# Patient Record
Sex: Male | Born: 1945
Health system: Southern US, Community
[De-identification: ages and names within clinical notes are randomized; demographics above are authoritative.]

## PROBLEM LIST (undated history)

## (undated) DIAGNOSIS — E785 Hyperlipidemia, unspecified: Secondary | ICD-10-CM

## (undated) DIAGNOSIS — J309 Allergic rhinitis, unspecified: Secondary | ICD-10-CM

## (undated) DIAGNOSIS — J189 Pneumonia, unspecified organism: Secondary | ICD-10-CM

## (undated) DIAGNOSIS — E119 Type 2 diabetes mellitus without complications: Secondary | ICD-10-CM

## (undated) DIAGNOSIS — J449 Chronic obstructive pulmonary disease, unspecified: Secondary | ICD-10-CM

## (undated) DIAGNOSIS — I2510 Atherosclerotic heart disease of native coronary artery without angina pectoris: Secondary | ICD-10-CM

## (undated) HISTORY — DX: Allergic rhinitis, unspecified: J30.9

## (undated) HISTORY — DX: Hyperlipidemia, unspecified: E78.5

## (undated) HISTORY — DX: Pneumonia, unspecified organism: J18.9

## (undated) HISTORY — DX: Chronic obstructive pulmonary disease, unspecified: J44.9

## (undated) HISTORY — PX: CARDIAC CATHETERIZATION: SHX172

## (undated) HISTORY — DX: Type 2 diabetes mellitus without complications: E11.9

---

## 1991-12-21 HISTORY — PX: ACHILLES TENDON SURGERY: SHX542

## 2000-04-26 ENCOUNTER — Encounter: Payer: Self-pay | Admitting: Otolaryngology

## 2000-04-27 ENCOUNTER — Ambulatory Visit (HOSPITAL_COMMUNITY): Admission: RE | Admit: 2000-04-27 | Discharge: 2000-04-27 | Payer: Self-pay | Admitting: Otolaryngology

## 2003-08-13 ENCOUNTER — Encounter: Admission: RE | Admit: 2003-08-13 | Discharge: 2003-11-11 | Payer: Self-pay | Admitting: Family Medicine

## 2004-09-14 ENCOUNTER — Encounter: Admission: RE | Admit: 2004-09-14 | Discharge: 2004-09-14 | Payer: Self-pay | Admitting: Family Medicine

## 2005-10-06 ENCOUNTER — Ambulatory Visit: Payer: Self-pay | Admitting: Critical Care Medicine

## 2005-10-20 ENCOUNTER — Ambulatory Visit: Payer: Self-pay

## 2005-11-16 ENCOUNTER — Ambulatory Visit: Payer: Self-pay | Admitting: Critical Care Medicine

## 2006-03-29 ENCOUNTER — Ambulatory Visit: Payer: Self-pay | Admitting: Critical Care Medicine

## 2006-08-03 ENCOUNTER — Ambulatory Visit: Payer: Self-pay | Admitting: Critical Care Medicine

## 2007-02-24 ENCOUNTER — Ambulatory Visit: Payer: Self-pay | Admitting: Critical Care Medicine

## 2009-01-03 ENCOUNTER — Ambulatory Visit: Payer: Self-pay | Admitting: Critical Care Medicine

## 2009-01-03 DIAGNOSIS — F17200 Nicotine dependence, unspecified, uncomplicated: Secondary | ICD-10-CM | POA: Insufficient documentation

## 2009-01-03 DIAGNOSIS — J449 Chronic obstructive pulmonary disease, unspecified: Secondary | ICD-10-CM | POA: Insufficient documentation

## 2009-01-06 ENCOUNTER — Encounter: Payer: Self-pay | Admitting: Critical Care Medicine

## 2010-03-24 ENCOUNTER — Ambulatory Visit: Payer: Self-pay | Admitting: Critical Care Medicine

## 2011-01-19 NOTE — Assessment & Plan Note (Signed)
Summary: Pulmonary OV   CC:  Follow up.  Last seen by PW 12/2008.  Pt states breathing is the same- no better or worse.  States he will have SOB when over exerting self.  States he will occ have wheezing and chest tightness.  Denies cough.  States he is still smoking 1 - 1 1/2 ppd.  C/o sinus pressure and drainage with clear mucus x 3-4 months.  History of Present Illness: Pulmonary OV 65  yo WM with chronic bronchitis,  mild peripheral airflow obstruction GOLDS stage 0, ongoing smoking use.     March 24, 2010 3:43 PM This pt is still wheezing and still has chest tightness.  The pt was   last seen one year ago. The pt is still smoking 1 1/2 ppd. ,   Has tried everything and nothing really worked to quit smoking but did have marginal benefit with nicotine patch.   Pt denies any significant sore throat, nasal congestion or excess secretions, fever, chills, sweats, unintended weight loss, pleurtic or exertional chest pain, orthopnea PND, or leg swelling Pt denies any increase in rescue therapy over baseline, denies waking up needing it or having any early am or nocturnal exacerbations of coughing/wheezing/or dyspnea.     Preventive Screening-Counseling & Management  Alcohol-Tobacco     Smoking Status: current     Packs/Day: 1.5  Current Medications (verified): 1)  Red Yeast Rice 600 Mg Caps (Red Yeast Rice Extract) .... One By Mouth Two Times A Day 2)  Vitamin C .... Once Daily 3)  Fish Oi ?dose .... Once Daily  Allergies (verified): No Known Drug Allergies  Past History:  Past medical, surgical, family and social histories (including risk factors) reviewed, and no changes noted (except as noted below).  Past Medical History: Reviewed history from 01/03/2009 and no changes required. Tendency to high blood sugar  diet alone chronic bronchitis/active smoking    -FeV1 81%  Fef 25-75 38% 2010    -CXR  no active disease 2010  Past Surgical History: Reviewed history from 01/03/2009  and no changes required. none  Family History: Reviewed history from 01/03/2009 and no changes required. lymphoma father Family History Hypertension mother  Social History: Reviewed history from 01/03/2009 and no changes required. Patient is a current smoker.  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  The patient denies shortness of breath at rest, productive cough, non-productive cough, coughing up blood, chest pain, irregular heartbeats, acid heartburn, indigestion, loss of appetite, weight change, abdominal pain, difficulty swallowing, sore throat, tooth/dental problems, headaches, nasal congestion/difficulty breathing through nose, sneezing, itching, ear ache, anxiety, depression, hand/feet swelling, joint stiffness or pain, rash, change in color of mucus, and fever.    Vital Signs:  Patient profile:   65 year old male Height:      71 inches Weight:      171.25 pounds BMI:     23.97 O2 Sat:      96 % on Room air Temp:     98.1 degrees F oral Pulse rate:   74 / minute BP sitting:   126 / 68  (right arm) Cuff size:   regular  Vitals Entered By: Gweneth Dimitri RN (March 24, 2010 3:38 PM)  O2 Flow:  Room air CC: Follow up.  Last seen by PW 12/2008.  Pt states breathing is the same- no  better or worse.  States he will have SOB when over exerting self.  States he will occ have wheezing and chest tightness.  Denies cough.  States he is still smoking 1 - 1 1/2 ppd.  C/o sinus pressure and drainage with clear mucus x 3-4 months Comments Medications reviewed with patient Daytime contact number verified with patient. Gweneth Dimitri RN  March 24, 2010 3:38 PM    Physical Exam  General:  thin.   Head:  normocephalic and atraumatic Eyes:  PERRLA/EOM intact; conjunctiva and sclera clear Ears:  TMs intact and clear with normal canals Nose:  no deformity, discharge, inflammation,  or lesions Mouth:  no deformity or lesions Neck:  no masses, thyromegaly, or abnormal cervical nodes Chest Wall:  no deformities noted Lungs:  decreased BS bilateral and prolonged exhilation.   Heart:  regular rate and rhythm, S1, S2 without murmurs, rubs, gallops, or clicks Abdomen:  bowel sounds positive; abdomen soft and non-tender without masses, or organomegaly Msk:  no deformity or scoliosis noted with normal posture Pulses:  pulses normal Extremities:  no clubbing, cyanosis, edema, or deformity noted Neurologic:  CN II-XII grossly intact with normal reflexes, coordination, muscle strength and tone Skin:  intact without lesions or rashes Cervical Nodes:  no significant adenopathy Axillary Nodes:  no significant adenopathy Psych:  alert and cooperative; normal mood and affect; normal attention span and concentration   Pulmonary Function Test Date: 03/24/2010 4:07 PM Gender: Male  Pre-Spirometry FVC    Value: 3.65 L/min   % Pred: 75.30 % FEV1    Value: 2.62 L     Pred: 3.65 L     % Pred: 71.70 % FEV1/FVC  Value: 71.62 %     % Pred: 95.20 %  Impression & Recommendations:  Problem # 1:  NICOTINE ADDICTION (ICD-305.1) Assessment Unchanged Ongoing nicotine addiction plan I spent reviewing smoking cessation guidelines, the pt will retry nicotine patch  the pt declined behavioural modifcation program Orders: Est. Patient Level IV (16109) Tobacco use cessation intensive >10 minutes (60454)  Problem # 2:  COUGH (ICD-786.2) Assessment: Unchanged Spirometry is stable, pt with mild peripheral airflow obstruction plan no inhaled medications for now  cont smoking cessation efforts Orders: Spirometry w/Graph (94010)  Medications Added to Medication List This Visit: 1)  Vitamin C  .... Once daily 2)  Fish Oi ?dose  .... Once daily  Complete Medication List: 1)  Red Yeast Rice 600 Mg Caps (Red yeast rice extract) .... One by mouth two times a day 2)  Vitamin C   .... Once daily 3)  Fish Oi ?dose  .... Once daily  Patient Instructions: 1)  Consider smoking cessation with nicoderm patch again. 2)  No other medication changes 3)  Return one year or as needed    CardioPerfect Spirometry  ID: [id number] Patient: Billy Lawson, Billy Lawson DOB: 1946/12/17 Age: 65 Years Old Sex: Male Race: White Height: 71 Weight: 171.25 PPD: 1.5 Status: Confirmed Past Medical History:  Tendency to high blood sugar  diet alone chronic bronchitis/active smoking    -FeV1 81%  Fef 25-75 38% 2010    -CXR  no active disease 2010  Recorded: 03/24/2010 4:07 PM  Parameter  Measured Predicted %Predicted FVC     3.65        4.85        75.30 FEV1     2.62        3.65        71.70 FEV1%  71.62        75.26        95.20 PEF    7.78        9.27        83.90   Comments: Moderate peripheral airflow obstruction, little change since 2010  Interpretation: Pre: FVC= 3.65L FEV1= 2.62L FEV1%= 71.6% 2.62/3.65 FEV1/FVC (03/24/2010 4:11:37 PM), Mild restriction

## 2011-01-25 ENCOUNTER — Ambulatory Visit: Payer: Self-pay | Admitting: Critical Care Medicine

## 2011-05-07 NOTE — Assessment & Plan Note (Signed)
Billy Lawson                               PULMONARY OFFICE NOTE   NAME:Billy Lawson, Billy Lawson                  MRN:          [id number]  DATE:08/03/2006                            DOB:          Jun 30, 1946    Billy Lawson is a 65 year old male with chronic bronchitis, still actively  smoking 1-1/2 packs a day of cigarettes.  He did not fill the Chantix.  He  stays depressed and has no motivation to quit smoking at this time.  He is  off all inhaled medications.   On exam, he is a depressed male in no distress.  Temperature 97, blood  pressure 120/80, pulse 71, saturation 99% room air.  Chest was completely  clear today to auscultation and percussion.  There was no evidence of wheeze  or rhonchi.  Cardiac exam showed a regular rate and rhythm without S3,  normal S1 S2.  Abdomen was soft, nontender.  Extremities showed no edema or  clubbing.   IMPRESSION:  1. Depression.  2. Active smoking use.  3. Mild intermittent bronchitis, stable air flow function.   RECOMMENDATIONS:  Hold off on further inhaled therapy.  He is referred back  to his primary care physician for consideration of antidepressant therapy  and then fill Chantix once his mood and affect are so inclined to be  motivated to take this medication.  Will see the patient back in followup as  needed.                                   Billy Cradle Delford Field, MD, FCCP   PEW/MedQ  DD:  08/03/2006  DT:  08/03/2006  Job #:  [id number]   cc:   C. Duane Lope, MD

## 2011-05-07 NOTE — Assessment & Plan Note (Signed)
[facility]                             PULMONARY OFFICE NOTE   NAME:Billy Lawson, Billy Lawson                  MRN:          [id number]  DATE:02/24/2007                            DOB:          1946-05-14    Mr. Pixie Casino is a 65 year old male with continued smoking use and  chronic bronchitis, still smoking a pack and half a day of cigarettes.  He had quit back in April of last year with Chantix, but then resumed  after the Chantix was stopped.  He wishes a renewal on the Chantix.   EXAMINATION:  VITAL SIGNS:  Temp 97, blood pressure 118/80, pulse 96,  saturation 100% room air.  CHEST:  Showed minimal obstructive changes with fairly clear lungs and  no significant adventitious breath sounds.  CARDIAC:  Exam showed a regular rate and rhythm without S3, normal S1,  S2.  ABDOMEN:  Soft, nontender.  EXTREMITIES:  Showed no edema, clubbing or venous disease.  SKIN:  Clear.  NEUROLOGIC:  Intact.  HEENT:  Showed no jugular venous distention, no lymphadenopathy.  Oropharynx clear.  NECK:  Supple.   IMPRESSION:  That of chronic bronchitis with active smoking, stable at  this time.   PLAN:  For the patient to maintain off inhaled medications but to renew  the Chantix.  Prescriptions were given.  Will see the patient back in  followup.     Charlcie Cradle Delford Field, MD, [facility]  Electronically Signed    PEW/MedQ  DD: 02/24/2007  DT: 02/24/2007  Job #: [id number]   cc:   C. Duane Lope, M.D.

## 2011-07-14 ENCOUNTER — Ambulatory Visit (INDEPENDENT_AMBULATORY_CARE_PROVIDER_SITE_OTHER)
Admission: RE | Admit: 2011-07-14 | Discharge: 2011-07-14 | Disposition: A | Discharge status: Home / Self Care | Payer: BC Managed Care – PPO | Source: Ambulatory Visit | Attending: Critical Care Medicine | Admitting: Critical Care Medicine

## 2011-07-14 ENCOUNTER — Encounter: Payer: Self-pay | Admitting: Critical Care Medicine

## 2011-07-14 ENCOUNTER — Ambulatory Visit (INDEPENDENT_AMBULATORY_CARE_PROVIDER_SITE_OTHER): Payer: BC Managed Care – PPO | Admitting: Critical Care Medicine

## 2011-07-14 VITALS — BP 120/80 | HR 73 | Temp 98.7°F | Ht 71.0 in | Wt 169.8 lb

## 2011-07-14 DIAGNOSIS — J209 Acute bronchitis, unspecified: Secondary | ICD-10-CM

## 2011-07-14 DIAGNOSIS — R059 Cough, unspecified: Secondary | ICD-10-CM

## 2011-07-14 DIAGNOSIS — R05 Cough: Secondary | ICD-10-CM

## 2011-07-14 DIAGNOSIS — F17200 Nicotine dependence, unspecified, uncomplicated: Secondary | ICD-10-CM

## 2011-07-14 MED ORDER — AZITHROMYCIN 250 MG PO TABS: ORAL_TABLET | ORAL | Status: DC

## 2011-07-14 NOTE — Patient Instructions (Signed)
Use Nicoderm CQ 21mg  patch daily then follow Nicoderm program May use Nicorette 4mg  Minis as needed Consider smoking cessation classes at [facility] Long Azithromycin for 5days Chest xray today Return  3 months

## 2011-07-14 NOTE — Progress Notes (Signed)
Quick Note:  Notify the patient that the Xray is stable , no abnormalities Work on smoking cessation No change in medications are recommended. Continue current meds as prescribed at last office visit ______

## 2011-07-14 NOTE — Progress Notes (Signed)
Quick Note:  Called, spoke with pt. He is aware of cxr results and recs per PW. He verbalized understanding of this. ______

## 2011-07-14 NOTE — Progress Notes (Signed)
Subjective:    Patient ID: Billy Lawson, male    DOB: 22-Sep-1946, 65 y.o.   MRN: [id number]  HPI Pulmonary OV  65 y.o.  WM with chronic bronchitis, mild peripheral airflow obstruction GOLDS stage 0, ongoing smoking use.  March 24, 2010 3:43 PM  This pt is still wheezing and still has chest tightness. The pt was last seen one year ago.  The pt is still smoking 1 1/2 ppd. ,  Has tried everything and nothing really worked to quit smoking but did have marginal benefit with nicotine patch.  Pt denies any significant sore throat, nasal congestion or excess secretions, fever, chills, sweats, unintended weight loss, pleurtic or exertional chest pain, orthopnea PND, or leg swelling  Pt denies any increase in rescue therapy over baseline, denies waking up needing it or having any early am or nocturnal exacerbations of coughing/wheezing/or dyspnea.   07/14/2011  Quit smoking end of jan 2012, started smoking again 6/12 Did to the smoking cessation program.    Now is coughing more .  FeV1 71% 4/11  Past Medical History  Diagnosis Date  . Chronic bronchitis   . COPD (chronic obstructive pulmonary disease)      Family History  Problem Relation Age of Onset  . Lymphoma Father   . Hypertension Mother      History   Social History  . Marital Status: Married    Spouse Name: N/A    Number of Children: N/A  . 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 Smoking status: Current Everyday Smoker -- 50 years    Types: Cigarettes  . Smokeless tobacco: Never Used   Comment: currently smoking 1/2 ppd  . Alcohol Use: Not on file  . Drug Use: Not on file  . Sexually Active: Not on file   Other Topics Concern  . Not on file   Social History Narrative  . No narrative on file     No Known Allergies   No outpatient prescriptions prior to visit.     Review of Systems Constitutional:   No  weight loss, night sweats,   Fevers, chills, fatigue, lassitude. HEENT:   No headaches,  Difficulty swallowing,  Tooth/dental problems,  Sore throat,                No sneezing, itching, ear ache, nasal congestion, post nasal drip,   CV:  No chest pain,  Orthopnea, PND, swelling in lower extremities, anasarca, dizziness, palpitations  GI  No heartburn, indigestion, abdominal pain, nausea, vomiting, diarrhea, change in bowel habits, loss of appetite  Resp: No shortness of breath with exertion or at rest.  No excess mucus, no productive cough,  No non-productive cough,  No coughing up of blood.  No change in color of mucus.  No wheezing.  No chest wall deformity  Skin: no rash or lesions.  GU: no dysuria, change in color of urine, no urgency or frequency.  No flank pain.  MS:  No joint pain or swelling.  No decreased range of motion.  No back pain.  Psych:  No change in mood or affect. No depression or anxiety.  No memory loss.  PFT Conversion 03/24/2010  FVC 3.65  FVC  % Predicted 75.3  FEV1 2.62  FEV1 PREDICT 3.65  FEV % Predicted 71.7  FEV1/FVC 71.8  FEV1/FVC%EXP 95.20  PEF % EXPECT 9.27  Objective:   Physical Exam Filed Vitals:   07/14/11 1207  BP: 120/80  Pulse: 73  Temp: 98.7 F (37.1 C)  TempSrc: Oral  Height: 5\' 11"  (1.803 m)  Weight: 169 lb 12.8 oz (77.021 kg)  SpO2: 99%    Gen: Pleasant, well-nourished, in no distress,  normal affect  ENT: No lesions,  mouth clear,  oropharynx clear, no postnasal drip  Neck: No JVD, no TMG, no carotid bruits  Lungs: No use of accessory muscles, no dullness to percussion, distant BS  Cardiovascular: RRR, heart sounds normal, no murmur or gallops, no peripheral edema  Abdomen: soft and NT, no HSM,  BS normal  Musculoskeletal: No deformities, no cyanosis or clubbing  Neuro: alert, non focal  Skin: Warm, no lesions or rashes        Assessment & Plan:   Cough Tracheobronchitis smoking induced with cough No need to repeat spiro Note CXR  shows NAD, Copd changes Plan zmax x 5days  Smoking cessation    Updated Medication List Outpatient Encounter Prescriptions as of 07/14/2011  Medication Sig Dispense Refill  . clobetasol (TEMOVATE) 0.05 % ointment As needed      . Omega-3 Fatty Acids (FISH OIL PO) Take by mouth daily.        . Red Yeast Rice 600 MG CAPS Take by mouth 2 (two) times daily.        Marland Kitchen azithromycin (ZITHROMAX) 250 MG tablet Take two once then one daily until gone   6 each  0

## 2011-07-15 NOTE — Assessment & Plan Note (Signed)
Tracheobronchitis smoking induced with cough No need to repeat spiro Note CXR shows NAD, Copd changes Plan zmax x 5days  Smoking cessation

## 2011-07-21 ENCOUNTER — Ambulatory Visit: Payer: Self-pay | Admitting: Critical Care Medicine

## 2012-02-18 ENCOUNTER — Ambulatory Visit: Payer: BC Managed Care – PPO | Admitting: Critical Care Medicine

## 2012-03-03 ENCOUNTER — Ambulatory Visit: Payer: BC Managed Care – PPO | Admitting: Critical Care Medicine

## 2012-03-03 ENCOUNTER — Ambulatory Visit (INDEPENDENT_AMBULATORY_CARE_PROVIDER_SITE_OTHER): Payer: BC Managed Care – PPO | Admitting: Critical Care Medicine

## 2012-03-03 ENCOUNTER — Encounter: Payer: Self-pay | Admitting: Critical Care Medicine

## 2012-03-03 VITALS — BP 138/92 | HR 79 | Temp 97.8°F | Ht 71.0 in | Wt 175.6 lb

## 2012-03-03 DIAGNOSIS — F418 Other specified anxiety disorders: Secondary | ICD-10-CM

## 2012-03-03 DIAGNOSIS — R05 Cough: Secondary | ICD-10-CM

## 2012-03-03 DIAGNOSIS — R059 Cough, unspecified: Secondary | ICD-10-CM

## 2012-03-03 DIAGNOSIS — F341 Dysthymic disorder: Secondary | ICD-10-CM

## 2012-03-03 MED ORDER — AZITHROMYCIN 250 MG PO TABS: 250.0000 mg | ORAL_TABLET | Freq: Every day | ORAL | Status: AC

## 2012-03-03 MED ORDER — BECLOMETHASONE DIPROPIONATE 80 MCG/ACT NA AERS: 2.0000 | INHALATION_SPRAY | Freq: Every day | NASAL | Status: DC

## 2012-03-03 MED ORDER — BUDESONIDE 180 MCG/ACT IN AEPB: 1.0000 | INHALATION_SPRAY | Freq: Two times a day (BID) | RESPIRATORY_TRACT | Status: DC

## 2012-03-03 MED ORDER — PREDNISONE 10 MG PO TABS: ORAL_TABLET | ORAL | Status: DC

## 2012-03-03 NOTE — Assessment & Plan Note (Signed)
Ongoing tracheobronchitis with associated chronic obstructive lungs disease and ongoing smoking Plan nextAzithromycin 250mg  Take two once then one daily until gone Prednisone 10mg  Take 4 for two days three for two days two for two days one for two days Qnasl two  puff each nostril until sample gone Pulmicort two puff twice daily until sample gone  Stop smoking with nicorette gum/patch Discuss your depression/anxiety symptoms with your primary care MD Return 6 months

## 2012-03-03 NOTE — Patient Instructions (Signed)
Azithromycin 250mg  Take two once then one daily until gone Prednisone 10mg  Take 4 for two days three for two days two for two days one for two days Qnasl two  puff each nostril until sample gone Pulmicort two puff twice daily until sample gone  Stop smoking with nicorette gum/patch Discuss your depression/anxiety symptoms with your primary care MD Return 6 months

## 2012-03-03 NOTE — Progress Notes (Signed)
Subjective:    Patient ID: Billy Lawson, male    DOB: [birth year], 66 y.o.   MRN: [id number]  HPI  Pulmonary OV  66 y.o.  WM with chronic bronchitis, mild peripheral airflow obstruction GOLDS stage 0, ongoing smoking use.  March 24, 2010 3:43 PM  This pt is still wheezing and still has chest tightness. The pt was last seen one year ago.  The pt is still smoking 1 1/2 ppd. ,  Has tried everything and nothing really worked to quit smoking but did have marginal benefit with nicotine patch.  Pt denies any significant sore throat, nasal congestion or excess secretions, fever, chills, sweats, unintended weight loss, pleurtic or exertional chest pain, orthopnea PND, or leg swelling  Pt denies any increase in rescue therapy over baseline, denies waking up needing it or having any early am or nocturnal exacerbations of coughing/wheezing/or dyspnea.   7/12  Quit smoking end of jan 2012, started smoking again 6/12 Did to the smoking cessation program.    Now is coughing more .  FeV1 71% 4/11  03/03/2012 Pt had quit twice since last ov 7/12.  Now smoking again 11/12. Pt notes one month of more congestion and dyspnea.     Past Medical History  Diagnosis Date  . Chronic bronchitis   . COPD (chronic obstructive pulmonary disease)      Family History  Problem Relation Age of Onset  . Lymphoma Father   . Hypertension Mother      History   Social History  . Marital Status: Married    Spouse Name: N/A    Number of Children: N/A  . 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 Smoking status: Current Everyday Smoker    Types: Cigarettes  . Smokeless tobacco: Never Used   Comment: currently smoking 1-2 cig/day.  Started at age 89.  Marland Kitchen Alcohol Use: Not on file  . Drug Use: Not on file  . Sexually Active: Not on file   Other Topics Concern  . Not on file   Social History Narrative  . No narrative on file      No Known Allergies   Outpatient Prescriptions Prior to Visit  Medication Sig Dispense Refill  . Omega-3 Fatty Acids (FISH OIL PO) Take by mouth daily.        . Red Yeast Rice 600 MG CAPS Take by mouth 2 (two) times daily.        Marland Kitchen azithromycin (ZITHROMAX) 250 MG tablet Take two once then one daily until gone   6 each  0  . clobetasol (TEMOVATE) 0.05 % ointment As needed         Review of Systems  Constitutional:   No  weight loss, night sweats,  Fevers, chills, fatigue, lassitude. HEENT:   No headaches,  Difficulty swallowing,  Tooth/dental problems,  Sore throat,                No sneezing, itching, ear ache, nasal congestion, post nasal drip,   CV:  No chest pain,  Orthopnea, PND, swelling in lower extremities, anasarca, dizziness, palpitations  GI  No heartburn, indigestion, abdominal pain, nausea, vomiting, diarrhea, change in bowel habits, loss of appetite  Resp: No shortness of breath with exertion or at rest.  No excess mucus, no productive cough,  No non-productive cough,  No coughing up of blood.  No change in  color of mucus.  No wheezing.  No chest wall deformity  Skin: no rash or lesions.  GU: no dysuria, change in color of urine, no urgency or frequency.  No flank pain.  MS:  No joint pain or swelling.  No decreased range of motion.  No back pain.  Psych:  No change in mood or affect. No depression or anxiety.  No memory loss.  PFT Conversion 03/24/2010  FVC 3.65  FVC  % Predicted 75.3  FEV1 2.62  FEV1 PREDICT 3.65  FEV % Predicted 71.7  FEV1/FVC 71.8  FEV1/FVC%EXP 95.20  PEF % EXPECT 9.27      Objective:   Physical Exam  Filed Vitals:   03/03/12 1130  BP: 138/92  Pulse: 79  Temp: 97.8 F (36.6 C)  TempSrc: Oral  Height: 5\' 11"  (1.803 m)  Weight: 175 lb 9.6 oz (79.652 kg)  SpO2: 98%    Gen: Pleasant, well-nourished, in no distress,  normal affect  ENT: No lesions,  mouth clear,  oropharynx clear, no postnasal drip  Neck: No JVD, no TMG, no  carotid bruits  Lungs: No use of accessory muscles, no dullness to percussion, distant BS  Cardiovascular: RRR, heart sounds normal, no murmur or gallops, no peripheral edema  Abdomen: soft and NT, no HSM,  BS normal  Musculoskeletal: No deformities, no cyanosis or clubbing  Neuro: alert, non focal  Skin: Warm, no lesions or rashes        Assessment & Plan:   Obstructive chronic bronchitis with exacerbation Ongoing tracheobronchitis with associated chronic obstructive lungs disease and ongoing smoking Plan nextAzithromycin 250mg  Take two once then one daily until gone Prednisone 10mg  Take 4 for two days three for two days two for two days one for two days Qnasl two  puff each nostril until sample gone Pulmicort two puff twice daily until sample gone  Stop smoking with nicorette gum/patch Discuss your depression/anxiety symptoms with your primary care MD Return 6 months      Updated Medication List Outpatient Encounter Prescriptions as of 03/03/2012  Medication Sig Dispense Refill  . Omega-3 Fatty Acids (FISH OIL PO) Take by mouth daily.        . Red Yeast Rice 600 MG CAPS Take by mouth 2 (two) times daily.        Marland Kitchen azithromycin (ZITHROMAX) 250 MG tablet Take 1 tablet (250 mg total) by mouth daily. Take two once then one daily until gone  6 each  0  . Beclomethasone Dipropionate (QNASL) 80 MCG/ACT AERS Place 2 puffs into the nose daily.  1 Inhaler  0  . budesonide (PULMICORT FLEXHALER) 180 MCG/ACT inhaler Inhale 1 puff into the lungs 2 (two) times daily.  1 each  0  . predniSONE (DELTASONE) 10 MG tablet Take 4 for two days three for two days two for two days one for two days  20 tablet  0  . DISCONTD: azithromycin (ZITHROMAX) 250 MG tablet Take two once then one daily until gone   6 each  0  . DISCONTD: clobetasol (TEMOVATE) 0.05 % ointment As needed

## 2012-08-07 ENCOUNTER — Ambulatory Visit: Payer: BC Managed Care – PPO | Admitting: Critical Care Medicine

## 2012-08-25 ENCOUNTER — Ambulatory Visit (INDEPENDENT_AMBULATORY_CARE_PROVIDER_SITE_OTHER): Payer: Medicare Other | Admitting: Critical Care Medicine

## 2012-08-25 ENCOUNTER — Encounter: Payer: Self-pay | Admitting: Critical Care Medicine

## 2012-08-25 VITALS — BP 150/88 | HR 81 | Temp 97.5°F | Ht 70.5 in | Wt 170.8 lb

## 2012-08-25 DIAGNOSIS — J441 Chronic obstructive pulmonary disease with (acute) exacerbation: Secondary | ICD-10-CM

## 2012-08-25 DIAGNOSIS — F17200 Nicotine dependence, unspecified, uncomplicated: Secondary | ICD-10-CM

## 2012-08-25 NOTE — Assessment & Plan Note (Signed)
Ongoing nicotine addiction but now smoking use down to 2-3 cigarettes per day Greater than 10 minutes smoking cessation counseling given to the patient Plan Pursue nicotine replacement therapy Return 6 months Note the patient deferred influenza vaccine

## 2012-08-25 NOTE — Patient Instructions (Addendum)
Continue to work on smoking cessation.   You can use patches and lozenge Ok to stay off inhalers for now Return 6 months You deferred the flu vaccine

## 2012-08-25 NOTE — Progress Notes (Signed)
Subjective:    Patient ID: Billy Lawson, male    DOB: Apr 14, 1946, 66 y.o.   MRN: [id number]  HPI  Pulmonary OV  66 y.o.  WM with chronic bronchitis, mild peripheral airflow obstruction GOLDS stage 0, ongoing smoking use.    08/25/2012 Pt still smoking.  Now smoking 1-3 or 4 per day.  Currently denies much congestion.  Frequent head colds.  Notes some qhs urination and nasal congestion as a side effect of meds.    No mucus is spit out, yellow out of nose.  No real chest pain.   Past Medical History  Diagnosis Date  . Chronic bronchitis   . COPD (chronic obstructive pulmonary disease)      Family History  Problem Relation Age of Onset  . Lymphoma Father   . Hypertension Mother      History   Social History  . Marital Status: Married    Spouse Name: N/A    Number of Children: N/A  . 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 Smoking status: Current Some Day Smoker -- 0.2 packs/day    Types: Cigarettes  . Smokeless tobacco: Never Used   Comment: currently smoking 10-15 cig/week.  Started at age 59.  Marland Kitchen Alcohol Use: Not on file  . Drug Use: Not on file  . Sexually Active: Not on file   Other Topics Concern  . Not on file   Social History Narrative  . No narrative on file     No Known Allergies   Outpatient Prescriptions Prior to Visit  Medication Sig Dispense Refill  . Omega-3 Fatty Acids (FISH OIL PO) Take by mouth daily.        . Red Yeast Rice 600 MG CAPS Take by mouth 2 (two) times daily.        . Beclomethasone Dipropionate (QNASL) 80 MCG/ACT AERS Place 2 puffs into the nose daily.  1 Inhaler  0  . budesonide (PULMICORT FLEXHALER) 180 MCG/ACT inhaler Inhale 1 puff into the lungs 2 (two) times daily.  1 each  0  . predniSONE (DELTASONE) 10 MG tablet Take 4 for two days three for two days two for two days one for two days  20 tablet  0     Review of Systems  Constitutional:   No   weight loss, night sweats,  Fevers, chills, fatigue, lassitude. HEENT:   No headaches,  Difficulty swallowing,  Tooth/dental problems,  Sore throat,                No sneezing, itching, ear ache, nasal congestion, post nasal drip,   CV:  No chest pain,  Orthopnea, PND, swelling in lower extremities, anasarca, dizziness, palpitations  GI  No heartburn, indigestion, abdominal pain, nausea, vomiting, diarrhea, change in bowel habits, loss of appetite  Resp: No shortness of breath with exertion or at rest.  No excess mucus, no productive cough,  No non-productive cough,  No coughing up of blood.  No change in color of mucus.  No wheezing.  No chest wall deformity  Skin: no rash or lesions.  GU: no dysuria, change in color of urine, no urgency or frequency.  No flank pain.  MS:  No joint pain or swelling.  No decreased range of motion.  No back pain.  Psych:  No change in mood or affect. No depression or anxiety.  No memory loss.  PFT Conversion 03/24/2010  FVC 3.65  FVC  % Predicted 75.3  FEV1 2.62  FEV1 PREDICT 3.65  FEV % Predicted 71.7  FEV1/FVC 71.8  FEV1/FVC%EXP 95.20  PEF % EXPECT 9.27      Objective:   Physical Exam  Filed Vitals:   08/25/12 1013  BP: 150/88  Pulse: 81  Temp: 97.5 F (36.4 C)  TempSrc: Oral  Height: 5' 10.5" (1.791 m)  Weight: 170 lb 12.8 oz (77.474 kg)  SpO2: 98%    Gen: Pleasant, well-nourished, in no distress,  normal affect  ENT: No lesions,  mouth clear,  oropharynx clear, no postnasal drip  Neck: No JVD, no TMG, no carotid bruits  Lungs: No use of accessory muscles, no dullness to percussion, distant BS  Cardiovascular: RRR, heart sounds normal, no murmur or gallops, no peripheral edema  Abdomen: soft and NT, no HSM,  BS normal  Musculoskeletal: No deformities, no cyanosis or clubbing  Neuro: alert, non focal  Skin: Warm, no lesions or rashes        Assessment & Plan:   Obstructive chronic bronchitis without  exacerbation History of tracheobronchitis with smoking-induced cough stable at this time Pulmonary functions unchanged since 2011 with mild airflow obstruction noted peripherally Ongoing tobacco use but dramatically reduced Patient given greater than 10 minutes smoking cessation counseling Plan Patient continued smoking cessation program with nicotine replacement therapy the patient understands that he may use nicotine patch and lozenge Discontinue further inhaled medications for now   NICOTINE ADDICTION Ongoing nicotine addiction but now smoking use down to 2-3 cigarettes per day Greater than 10 minutes smoking cessation counseling given to the patient Plan Pursue nicotine replacement therapy Return 6 months Note the patient deferred influenza vaccine    Updated Medication List Outpatient Encounter Prescriptions as of 08/25/2012  Medication Sig Dispense Refill  . Omega-3 Fatty Acids (FISH OIL PO) Take by mouth daily.        . Red Yeast Rice 600 MG CAPS Take by mouth 2 (two) times daily.        Marland Kitchen DISCONTD: Beclomethasone Dipropionate (QNASL) 80 MCG/ACT AERS Place 2 puffs into the nose daily.  1 Inhaler  0  . DISCONTD: budesonide (PULMICORT FLEXHALER) 180 MCG/ACT inhaler Inhale 1 puff into the lungs 2 (two) times daily.  1 each  0  . DISCONTD: predniSONE (DELTASONE) 10 MG tablet Take 4 for two days three for two days two for two days one for two days  20 tablet  0

## 2012-08-25 NOTE — Assessment & Plan Note (Addendum)
History of tracheobronchitis with smoking-induced cough stable at this time Pulmonary functions unchanged since 2011 with mild airflow obstruction noted peripherally Ongoing tobacco use but dramatically reduced Patient given greater than 10 minutes smoking cessation counseling Plan Patient continued smoking cessation program with nicotine replacement therapy the patient understands that he may use nicotine patch and lozenge Discontinue further inhaled medications for now

## 2013-03-21 ENCOUNTER — Ambulatory Visit: Payer: Medicare Other | Admitting: Critical Care Medicine

## 2013-04-05 ENCOUNTER — Ambulatory Visit (INDEPENDENT_AMBULATORY_CARE_PROVIDER_SITE_OTHER): Payer: Medicare Other | Admitting: Critical Care Medicine

## 2013-04-05 ENCOUNTER — Encounter: Payer: Self-pay | Admitting: Critical Care Medicine

## 2013-04-05 VITALS — BP 140/88 | HR 79 | Temp 97.4°F | Ht 71.0 in | Wt 179.0 lb

## 2013-04-05 DIAGNOSIS — F17200 Nicotine dependence, unspecified, uncomplicated: Secondary | ICD-10-CM

## 2013-04-05 DIAGNOSIS — J449 Chronic obstructive pulmonary disease, unspecified: Secondary | ICD-10-CM

## 2013-04-05 NOTE — Patient Instructions (Addendum)
No change in medications. Return in        6 months        

## 2013-04-05 NOTE — Progress Notes (Signed)
Subjective:    Patient ID: Billy Lawson, male    DOB: [birth year], 67 y.o.   MRN: [id number]  HPI  Pulmonary OV  67 y.o.  WM with chronic bronchitis, mild peripheral airflow obstruction GOLDS stage 0, ongoing smoking use.   04/05/2013 Notes some coughing with pndrip.  Had one URI over the winter in 01/2013. No ABX given.  Using afrin at night.   No discolored mucus. No change in dyspnea. Sedentary this winter.  No exercise.  Nicotine tablets. Now on nicotine lozenges   Past Medical History  Diagnosis Date  . Chronic bronchitis   . COPD (chronic obstructive pulmonary disease)      Family History  Problem Relation Age of Onset  . Lymphoma Father   . Hypertension Mother      History   Social History  . Marital Status: Married    Spouse Name: N/A    Number of Children: N/A  . 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 Smoking status: Former Smoker -- 1.50 packs/day for 38 years    Types: Cigarettes    Quit date: 07/14/2012  . Smokeless tobacco: Never Used     Comment: Currently using nicotine minis.  . Alcohol Use: Not on file  . Drug Use: Not on file  . Sexually Active: Not on file   Other Topics Concern  . Not on file   Social History Narrative  . No narrative on file     No Known Allergies   Outpatient Prescriptions Prior to Visit  Medication Sig Dispense Refill  . Omega-3 Fatty Acids (FISH OIL PO) Take by mouth daily.        . Red Yeast Rice 600 MG CAPS Take by mouth 2 (two) times daily.         No facility-administered medications prior to visit.     Review of Systems  Constitutional:   No  weight loss, night sweats,  Fevers, chills, fatigue, lassitude. HEENT:   No headaches,  Difficulty swallowing,  Tooth/dental problems,  Sore throat,                No sneezing, itching, ear ache, nasal congestion, post nasal drip,   CV:  No chest pain,  Orthopnea, PND, swelling in  lower extremities, anasarca, dizziness, palpitations  GI  No heartburn, indigestion, abdominal pain, nausea, vomiting, diarrhea, change in bowel habits, loss of appetite  Resp: No shortness of breath with exertion or at rest.  No excess mucus, no productive cough,  No non-productive cough,  No coughing up of blood.  No change in color of mucus.  No wheezing.  No chest wall deformity  Skin: no rash or lesions.  GU: no dysuria, change in color of urine, no urgency or frequency.  No flank pain.  MS:  No joint pain or swelling.  No decreased range of motion.  No back pain.  Psych:  No change in mood or affect. No depression or anxiety.  No memory loss.  PFT Conversion 03/24/2010  FVC 3.65  FVC  % Predicted 75.3  FEV1 2.62  FEV1 PREDICT 3.65  FEV % Predicted 71.7  FEV1/FVC 71.8  FEV1/FVC%EXP 95.20  PEF % EXPECT 9.27      Objective:   Physical Exam  Filed Vitals:   04/05/13 1040  BP: 140/88  Pulse: 79  Temp: 97.4 F (36.3 C)  TempSrc: Oral  Height: 5\' 11"  (1.803 m)  Weight: 179 lb (81.194 kg)  SpO2: 97%    Gen: Pleasant, well-nourished, in no distress,  normal affect  ENT: No lesions,  mouth clear,  oropharynx clear, no postnasal drip  Neck: No JVD, no TMG, no carotid bruits  Lungs: No use of accessory muscles, no dullness to percussion, distant BS  Cardiovascular: RRR, heart sounds normal, no murmur or gallops, no peripheral edema  Abdomen: soft and NT, no HSM,  BS normal  Musculoskeletal: No deformities, no cyanosis or clubbing  Neuro: alert, non focal  Skin: Warm, no lesions or rashes        Assessment & Plan:   Obstructive chronic bronchitis without exacerbation Chronic obstructive lung disease with an asthmatic bronchitic component now improved off smoking Plan Continue nicotine as needed for smoking cessation No indication for planned inhaled medications Return 6 months  NICOTINE ADDICTION Improved nicotine addiction with off smoking  cessation Plan Maintain nicotine replacement therapy    Updated Medication List Outpatient Encounter Prescriptions as of 04/05/2013  Medication Sig Dispense Refill  . Coenzyme Q10 (CO Q-10 PO) Take 1 capsule by mouth daily.      . Omega-3 Fatty Acids (FISH OIL PO) Take by mouth daily.        . Red Yeast Rice 600 MG CAPS Take by mouth 2 (two) times daily.         No facility-administered encounter medications on file as of 04/05/2013.

## 2013-04-05 NOTE — Assessment & Plan Note (Signed)
Improved nicotine addiction with off smoking cessation Plan Maintain nicotine replacement therapy

## 2013-04-05 NOTE — Assessment & Plan Note (Signed)
Chronic obstructive lung disease with an asthmatic bronchitic component now improved off smoking Plan Continue nicotine as needed for smoking cessation No indication for planned inhaled medications Return 6 months

## 2013-05-08 ENCOUNTER — Ambulatory Visit: Payer: Medicare Other | Admitting: Critical Care Medicine

## 2013-09-06 ENCOUNTER — Encounter: Payer: Medicare Other | Attending: Obstetrics and Gynecology | Admitting: *Deleted

## 2013-09-06 ENCOUNTER — Encounter: Payer: Self-pay | Admitting: *Deleted

## 2013-09-06 VITALS — Ht 71.0 in | Wt 166.7 lb

## 2013-09-06 DIAGNOSIS — E119 Type 2 diabetes mellitus without complications: Secondary | ICD-10-CM | POA: Insufficient documentation

## 2013-09-06 DIAGNOSIS — Z713 Dietary counseling and surveillance: Secondary | ICD-10-CM | POA: Insufficient documentation

## 2013-09-06 NOTE — Progress Notes (Signed)
Appt start time: 1100 end time:  1200.  Assessment:  Patient was seen on  09/06/13 for individual diabetes education. Newly diagnosed with DM2, history of several years of pre-diabetes. History of lots of fast food when he was working. Retired 2 years ago. He has a meter and is testing FBG and 2 hours after supper as able. Reported range is 112-130 usually before breakfast and after supper in 150 range with one reading at 182. Hasn't been feeling well this past week.  Current HbA1c: 6.8%  MEDICATIONS: see list. No diabetes medications yet   DIETARY INTAKE: Usual eating pattern includes 3 meals and 2-3 snacks per day.  Everyday foods include fair variety of all food groups.  Avoided foods include: none stated.    24-hr recall:  B ( AM): coffee, no longer adds sugar to coffee, fat free creamer Snk (11 AM):  Bowl of unsweetened cereal with 1% milk, occasionally fresh fruit L ( PM): sandwich with lean meat and cheese, OR fast food wrap, Fanta Orange drink or 1/2 and 1/2 sweet tea Snk ( PM): none unless nuts or avocado with salsa and chips infrequently D ( PM): eat out often, enjoys [place], OR lean meat grilled, vegetables, starch, less bread, water Snk ( PM): occasionally glass of milk Beverages: coffee, Fanta Orange drink, low sugar iced tea, water  Usual physical activity: just joined [employer] 2 months ago, works machines, 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 (No pool at that gym.)  Estimated energy needs: 1600 calories 180 g carbohydrates 120 g protein 44 g fat  Progress Towards Goal(s):  In progress.   Nutritional Diagnosis:  NB-1.1 Food and nutrition-related knowledge deficit As related to new diagnosis of diabetes.  As evidenced by A1c of 6.8%.    Intervention:  Nutrition counseling provided.  Discussed diabetes disease process and treatment options.  Discussed physiology of diabetes and role of obesity  on insulin resistance.  Encouraged moderate weight reduction to improve glucose levels.  Discussed role of medications and diet in glucose control  Provided education on macronutrients on glucose levels.  Provided education on carb counting, importance of regularly scheduled meals/snacks, and meal planning  Discussed effects of physical activity on glucose levels and long-term glucose control.  Recommended 150 minutes of physical activity/week.  Reviewed patient medications.  Discussed role of medication on blood glucose and possible side effects  Discussed blood glucose monitoring and interpretation.  Discussed recommended target ranges and individual ranges.    Described short-term complications: hyper- and hypo-glycemia.  Discussed causes,symptoms, and treatment options.  Discussed prevention, detection, and treatment of long-term complications.  Discussed the role of prolonged elevated glucose levels on body systems.  Discussed role of stress on blood glucose levels and discussed strategies to manage psychosocial issues.  Discussed recommendations for long-term diabetes self-care.  Established checklist for medical, dental, and emotional self-care.  Plan:  Aim for 3 Carb Choices per meal (45 grams) +/- 1 either way  Aim for 0-2 Carbs per snack if hungry  Consider reading food labels for Total Carbohydrate of foods Consider ways of increasing your activity level daily as tolerated Consider checking BG at alternate times per day as directed by MD  Handouts given during visit include: Living Well with Diabetes Carb Counting and Food Label handouts Meal Plan Card  Barriers to learning/adherance to lifestyle change: none stated  Diabetes self-care support plan:   [facility] support group available  Monitoring/Evaluation:  Dietary intake, exercise, reading food labels, and body weight in 4 week(s).

## 2013-09-06 NOTE — Patient Instructions (Signed)
Plan:  Aim for 3 Carb Choices per meal (45 grams) +/- 1 either way  Aim for 0-2 Carbs per snack if hungry  Consider reading food labels for Total Carbohydrate of foods Consider ways of increasing your activity level daily as tolerated Consider checking BG at alternate times per day as directed by MD

## 2013-10-04 ENCOUNTER — Encounter: Payer: Self-pay | Admitting: *Deleted

## 2013-10-04 ENCOUNTER — Encounter: Payer: Medicare Other | Attending: Obstetrics and Gynecology | Admitting: *Deleted

## 2013-10-04 VITALS — Ht 71.0 in | Wt 168.5 lb

## 2013-10-04 DIAGNOSIS — E119 Type 2 diabetes mellitus without complications: Secondary | ICD-10-CM | POA: Insufficient documentation

## 2013-10-04 DIAGNOSIS — Z713 Dietary counseling and surveillance: Secondary | ICD-10-CM | POA: Insufficient documentation

## 2013-10-04 NOTE — Patient Instructions (Signed)
Plan:  Aim for 3 Carb Choices per meal (45 grams) +/- 1 either way  Aim for 0-2 Carbs per snack if hungry  Consider reading food labels for Total Carbohydrate of foods Consider going to gym or walking on trails regularly as way of increasing activity level daily as tolerated Continue checking BG at alternate times per day as directed by MD

## 2013-10-04 NOTE — Progress Notes (Signed)
Appt start time: 1200 end time:  1245.  Assessment:  Patient was seen on  10/04/13 for individual diabetes education follow up visit. Patient here with his wife who participates in the visit. Weight is stable at 168.5 pounds. He is much more aware of his carb intake and is limiting to 3-4 choices per meal typically. He is SMBG daily and has realized with a higher carb meal (Mexican or pasta) his post meal BG may go up to 200 mg/dl but usually pre meals are under 130 and post meals under 180 mg/dl. He is retired but is not on any set schedule for meals or exercise.   Current HbA1c: 6.8%  MEDICATIONS: see list. No diabetes medications yet   DIETARY INTAKE: Usual eating pattern includes 3 meals and 2-3 snacks per day.  Everyday foods include fair variety of all food groups.  Avoided foods include: none stated.    24-hr recall:  B ( AM): coffee, no longer adds sugar to coffee, fat free creamer Snk (11 AM):  Bowl of unsweetened cereal with 1% milk, occasionally fresh fruit L ( PM): sandwich with lean meat and cheese, OR fast food wrap, Fanta Orange drink or 1/2 and 1/2 sweet tea Snk ( PM): none unless nuts or avocado with salsa and chips infrequently D ( PM): eat out often, enjoys [place], OR lean meat grilled, vegetables, starch, less bread, water Snk ( PM): occasionally glass of milk Beverages: coffee, Fanta Orange drink, low sugar iced tea, water  Usual physical activity: just joined [employer] 2 months ago, works machines, 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 (No pool at that gym.)  Estimated energy needs: 1600 calories 180 g carbohydrates 120 g protein 44 g fat  Progress Towards Goal(s):  In progress.   Nutritional Diagnosis:  NB-1.1 Food and nutrition-related knowledge deficit As related to new diagnosis of diabetes.  As evidenced by A1c of 6.8%.    Intervention:  Nutrition counseling continued. Answered  questions regarding carb counting and eating out in restaurants. Encouraged him to consider types of exercise he might enjoy and to set up a time of day to do it with his retirement. Commended him on his BG management and reinforced that his BGs are within target ranges most of the time.  Plan:  Aim for 3 Carb Choices per meal (45 grams) +/- 1 either way  Aim for 0-2 Carbs per snack if hungry  Consider reading food labels for Total Carbohydrate of foods Consider going to gym or walking on trails regularly as way of increasing activity level daily as tolerated Continue checking BG at alternate times per day as directed by MD   Handouts given during visit include: A1c Handout  Barriers to learning/adherance to lifestyle change: none stated  Diabetes self-care support plan:   [facility] support group available  Monitoring/Evaluation:  Dietary intake, exercise, reading food labels, and body weight as needed

## 2013-11-12 ENCOUNTER — Ambulatory Visit: Payer: Medicare Other | Admitting: Critical Care Medicine

## 2014-01-08 ENCOUNTER — Ambulatory Visit: Payer: Medicare Other | Admitting: Critical Care Medicine

## 2014-10-16 ENCOUNTER — Encounter: Payer: Self-pay | Admitting: Critical Care Medicine

## 2014-10-16 ENCOUNTER — Ambulatory Visit (INDEPENDENT_AMBULATORY_CARE_PROVIDER_SITE_OTHER): Payer: Medicare Other | Admitting: Critical Care Medicine

## 2014-10-16 VITALS — BP 150/80 | HR 78 | Temp 98.0°F | Ht 71.0 in | Wt 168.0 lb

## 2014-10-16 DIAGNOSIS — F17200 Nicotine dependence, unspecified, uncomplicated: Secondary | ICD-10-CM

## 2014-10-16 DIAGNOSIS — J449 Chronic obstructive pulmonary disease, unspecified: Secondary | ICD-10-CM

## 2014-10-16 DIAGNOSIS — Z720 Tobacco use: Secondary | ICD-10-CM

## 2014-10-16 NOTE — Assessment & Plan Note (Signed)
>  57min smoking cessation counseling issued to the pt Use 5-6 nicorette minis per day Consider psychologic counseling for smoking cessation Return 1 year

## 2014-10-16 NOTE — Progress Notes (Signed)
Subjective:    Patient ID: Billy Lawson, male    DOB: [birth year], 68 y.o.   MRN: [id number]  HPI  Pulmonary OV  68 y.o.  WM with chronic bronchitis, mild peripheral airflow obstruction GOLDS stage 0, ongoing smoking use.   10/16/2014 Chief Complaint  Patient presents with  . Follow-up    PND, nonprod cough, and sinus pressure/congestion qhs.  No SOB or chest tightness/pain.  Since last ov no new issues.  Notes some dry cough.  Now dx DM2 .  Now exercising more on TMT. Pt still smoking on ave 1 cig per day. Using nicorette minis ave 4-5 per day.  When was smoking 2 - 2.5 PPD Review of Systems  Constitutional:   No  weight loss, night sweats,  Fevers, chills, fatigue, lassitude. HEENT:   No headaches,  Difficulty swallowing,  Tooth/dental problems,  Sore throat,                No sneezing, itching, ear ache, nasal congestion, post nasal drip,   CV:  No chest pain,  Orthopnea, PND, swelling in lower extremities, anasarca, dizziness, palpitations  GI  No heartburn, indigestion, abdominal pain, nausea, vomiting, diarrhea, change in bowel habits, loss of appetite  Resp: No shortness of breath with exertion or at rest.  No excess mucus, no productive cough,  No non-productive cough,  No coughing up of blood.  No change in color of mucus.  No wheezing.  No chest wall deformity  Skin: no rash or lesions.  GU: no dysuria, change in color of urine, no urgency or frequency.  No flank pain.  MS:  No joint pain or swelling.  No decreased range of motion.  No back pain.  Psych:  No change in mood or affect. No depression or anxiety.  No memory loss.     Objective:   Physical Exam  Filed Vitals:   10/16/14 1106  BP: 150/80  Pulse: 78  Temp: 98 F (36.7 C)  TempSrc: Oral  Height: 5\' 11"  (1.803 m)  Weight: 168 lb (76.204 kg)  SpO2: 96%    Gen: Pleasant, well-nourished, in no distress,  normal affect  ENT: No lesions,  mouth clear,  oropharynx clear, no postnasal  drip  Neck: No JVD, no TMG, no carotid bruits  Lungs: No use of accessory muscles, no dullness to percussion, distant BS  Cardiovascular: RRR, heart sounds normal, no murmur or gallops, no peripheral edema  Abdomen: soft and NT, no HSM,  BS normal  Musculoskeletal: No deformities, no cyanosis or clubbing  Neuro: alert, non focal  Skin: Warm, no lesions or rashes      Assessment & Plan:   Obstructive chronic bronchitis without exacerbation Copd mild with ongoing tobacco abuse , high cancer risk Plan Use 5-6 nicorette minis per day and stop smoking Consider low dose CT Chest screening program Consider psychologic counseling for smoking cessation A spirometry will be obtained  Return 1 year    NICOTINE ADDICTION >61min smoking cessation counseling issued to the pt Use 5-6 nicorette minis per day Consider psychologic counseling for smoking cessation Return 1 year      Updated Medication List Outpatient Encounter Prescriptions as of 10/16/2014  Medication Sig  . ezetimibe (ZETIA) 10 MG tablet Take 10 mg by mouth daily.  . Omega-3 Fatty Acids (FISH OIL PO) Take by mouth every other day.   . Red Yeast Rice 600 MG CAPS Take 1 capsule by mouth daily.   . [DISCONTINUED] Coenzyme Q10 (  CO Q-10 PO) Take 1 capsule by mouth daily.

## 2014-10-16 NOTE — Assessment & Plan Note (Signed)
Copd mild with ongoing tobacco abuse , high cancer risk Plan Use 5-6 nicorette minis per day and stop smoking Consider low dose CT Chest screening program Consider psychologic counseling for smoking cessation A spirometry will be obtained  Return 1 year

## 2014-10-16 NOTE — Patient Instructions (Signed)
Use 5-6 nicorette minis per day Consider low dose CT Chest screening program Consider psychologic counseling for smoking cessation A spirometry will be obtained  Return 1 year

## 2014-11-05 ENCOUNTER — Other Ambulatory Visit: Payer: Self-pay | Admitting: Critical Care Medicine

## 2014-11-05 DIAGNOSIS — R0600 Dyspnea, unspecified: Secondary | ICD-10-CM

## 2014-11-06 ENCOUNTER — Ambulatory Visit (INDEPENDENT_AMBULATORY_CARE_PROVIDER_SITE_OTHER): Payer: Medicare Other | Admitting: Critical Care Medicine

## 2014-11-06 DIAGNOSIS — R0600 Dyspnea, unspecified: Secondary | ICD-10-CM

## 2014-11-06 LAB — PULMONARY FUNCTION TEST
FEF 25-75 Post: 2.08 L/sec
FEF 25-75 Pre: 1.66 L/sec
FEF2575-%Change-Post: 25 %
FEF2575-%PRED-PRE: 61 %
FEF2575-%Pred-Post: 77 %
FEV1-%Change-Post: 4 %
FEV1-%Pred-Post: 87 %
FEV1-%Pred-Pre: 83 %
FEV1-Post: 3.03 L
FEV1-Pre: 2.9 L
FEV1FVC-%Change-Post: -2 %
FEV1FVC-%Pred-Pre: 93 %
FEV6-%CHANGE-POST: 7 %
FEV6-%Pred-Post: 97 %
FEV6-%Pred-Pre: 90 %
FEV6-Post: 4.34 L
FEV6-Pre: 4.05 L
FEV6FVC-%Change-Post: 0 %
FEV6FVC-%PRED-POST: 101 %
FEV6FVC-%Pred-Pre: 101 %
FVC-%Change-Post: 7 %
FVC-%PRED-POST: 95 %
FVC-%Pred-Pre: 89 %
FVC-PRE: 4.2 L
FVC-Post: 4.5 L
PRE FEV6/FVC RATIO: 96 %
Post FEV1/FVC ratio: 67 %
Post FEV6/FVC ratio: 96 %
Pre FEV1/FVC ratio: 69 %

## 2014-11-06 NOTE — Progress Notes (Signed)
Spirometry pre and post done today. 

## 2014-11-08 NOTE — Progress Notes (Signed)
Quick Note:  Called, spoke with pt. Informed him of PFT results and recs per Dr. Joya Gaskins. He verbalized understanding and voiced no further questions or concerns at this time. ______

## 2016-01-29 ENCOUNTER — Telehealth: Payer: Self-pay | Admitting: Critical Care Medicine

## 2016-01-29 NOTE — Telephone Encounter (Signed)
Called and spoke to pt. Former PW pt requesting an appt. Scheduled OV with Dr. Ashok Cordia on 02/25/2016. Pt verbalized understanding and denied any further questions or concerns at this time.

## 2016-02-24 ENCOUNTER — Telehealth: Payer: Self-pay | Admitting: Pulmonary Disease

## 2016-02-24 NOTE — Telephone Encounter (Signed)
PFT 11/06/14: FVC 4.20 L (89%) FEV1 2.90 L (83%) FEV1/FVC 0.69 FEF 25-75 1.66 L (61%) no bronchodilator response 08/25/12: FVC 4.08 L (87%) FEV1 2.83 L (78%) FEV1/FVC 0.69 FEF 25-75 1.68 L (51%)

## 2016-02-25 ENCOUNTER — Other Ambulatory Visit: Payer: Medicare Other

## 2016-02-25 ENCOUNTER — Encounter: Payer: Self-pay | Admitting: Pulmonary Disease

## 2016-02-25 ENCOUNTER — Ambulatory Visit (INDEPENDENT_AMBULATORY_CARE_PROVIDER_SITE_OTHER): Payer: Medicare Other | Admitting: Pulmonary Disease

## 2016-02-25 VITALS — BP 120/60 | HR 78 | Ht 71.0 in | Wt 173.6 lb

## 2016-02-25 DIAGNOSIS — J301 Allergic rhinitis due to pollen: Secondary | ICD-10-CM | POA: Diagnosis not present

## 2016-02-25 DIAGNOSIS — J309 Allergic rhinitis, unspecified: Secondary | ICD-10-CM | POA: Insufficient documentation

## 2016-02-25 DIAGNOSIS — J449 Chronic obstructive pulmonary disease, unspecified: Secondary | ICD-10-CM

## 2016-02-25 DIAGNOSIS — F17200 Nicotine dependence, unspecified, uncomplicated: Secondary | ICD-10-CM

## 2016-02-25 NOTE — Progress Notes (Signed)
Subjective:    Patient ID: Billy Lawson, male    DOB: [birth year], 70 y.o.   MRN: [id number]  C.C.:  Follow-up for Mild COPD & Tobacco Use Disorder.  HPI  Mild COPD:  He reports in his early 28s he did have breathing difficulties during a time of high stress. At that time he was on an inhaler for wheeze. He reports remote pneumonia in his 75s. No history of recurrent bronchitis. He admits he is not active with strenuous activity and paces himself. Denies any wheezing or coughing. Currently is on no inhalers. No exacerbations since his last appointment.   Tobacco Use Disorder:  He reports he has started smoking intermittently. He smokes every 2-3 weeks. He admits his entire family is against his tobacco use and only uses when he is with other smokers or off by himself. He reports he does "enjoy" smoking. Has been using nicotine minis and previous used patches.   Review of Systems He does report recent he has had more sinus congestion & drainage. No chest pain, tightness, or pressure. No reflux, dyspepsia, nausea, or abdominal pain. Does report diffuse muscle aches particularly in his back.   No Known Allergies  Current Outpatient Prescriptions on File Prior to Visit  Medication Sig Dispense Refill  . Red Yeast Rice 600 MG CAPS Take 1 capsule by mouth daily.      No current facility-administered medications on file prior to visit.    Past Medical History  Diagnosis Date  . COPD (chronic obstructive pulmonary disease) ([place])   . Diabetes mellitus without complication ([place])   . Hyperlipidemia   . Allergic rhinitis   . Pneumonia     once in his 44s & was hospitalized without intubation    Past Surgical History  Procedure Laterality Date  . Achilles tendon surgery  1993    Family History  Problem Relation Age of Onset  . Lymphoma Father   . Hypertension Mother   . Diabetes Mother   . Cancer Brother     Tonsil  . Diabetes Paternal Grandmother   . Aortic aneurysm Paternal  Grandfather   . Lung disease Neg Hx     Social History   Social History  . Marital Status: Married    Spouse Name: N/A  . Number of Children: N/A  . 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 Smoking status: Current Some Day Smoker -- 0.20 packs/day    Types: Cigarettes    Start date: 12/21/1963  . Smokeless tobacco: Never Used     Comment: smoking approx 3 cigarettes every 2-3 weeks - Quit for 1 year - Peak rate of 2.5ppd  . Alcohol Use: No     Comment: Recovering alcoholic   . Drug Use: No     Comment: Remote marijuana  . Sexual Activity: Not Asked   Other Topics Concern  . None   Social History Narrative   Originally from [place], [place]. Previously has lived in [place]. Moved to [place] in 1965. Previously worked in Advertising copywriter business. Enjoys reading and playing music - guitar & piano. No pets currently. No bird, mold, or hot tub exposure.       Objective:   Physical Exam BP 120/60 mmHg  Pulse 78  Ht 5\' 11"  (1.803 m)  Wt 173 lb 9.6 oz (78.744 kg)  BMI 24.22 kg/m2  SpO2 100% General:  Awake. Alert. No acute distress. Thin, healthy male.  Integument:  Warm & dry. No rash on exposed skin. No bruising. HEENT:  Moist mucus membranes. No oral ulcers. No scleral injection or icterus. Moderate bilateral nasal turbinate swelling. Cardiovascular:  Regular rate. No edema. No appreciable JVD.  Pulmonary:  Good aeration & clear to auscultation bilaterally. Symmetric chest wall expansion. No accessory muscle use on room air. Abdomen: Soft. Normal bowel sounds. Nondistended.  Musculoskeletal:  Normal bulk and tone. Hand grip strength 5/5 bilaterally. No joint deformity or effusion appreciated.  PFT 11/06/14: FVC 4.20 L (89%) FEV1 2.90 L (83%) FEV1/FVC 0.69 FEF 25-75 1.66 L (61%) no bronchodilator response 08/25/12: FVC 4.08 L (87%) FEV1 2.83 L (78%) FEV1/FVC 0.69 FEF 25-75 1.68 L (51%)    Assessment  & Plan:  70 year old male with ongoing tobacco use and known underlying mild COPD based on my review of his prior spirometry from 2015 without a significant bronchodilator response. Patient seems to be having mild symptoms related to allergic rhinitis from increasing pollen counts. He is completely asymptomatic with regards to his COPD but admits he is not extremely physically active. I spent over 3 minutes today counseling the patient on the potential negative side effects of ongoing tobacco use but the patient drives great enjoyment from his limited tobacco exposure. We did discuss the risk of malignancy and he is willing to entertain the idea of low-dose  CT imaging for lung cancer screening. I instructed the patient to contact my office if he had any new breathing problems before his next appointment as I would be happy to see him sooner.  1. Mild COPD: Screening for alpha-1 antitrypsin deficiency. Checking full pulmonary function testing at follow-up appointment. Deferring initiation of inhaler medications at this time. 2. Tobacco use disorder: Spent over 3 minutes counseling the patient will need for complete tobacco cessation. He is unwilling to quit at this time. Referring the patient to our lung cancer screening coordinator. 3. Allergic rhinitis: Minimal symptoms related to increasing pollen count & season changes. 4. Health maintenance: Patient reports he did receive influenza and a pneumonia vaccine in October 2016 but is unsure which pneumonia vaccine. 5. Follow-up: Patient to return to clinic one year or sooner if needed.  Sonia Baller Ashok Cordia, M.D. [facility] Pager:  [phone] After 3pm or if no response, call [phone] 9:36 AM 02/25/2016

## 2016-02-25 NOTE — Patient Instructions (Signed)
   Call me if you have any new breathing problems before your next appointment.  We will do a breathing test to check for lung function at your next appointment  I will see you back in 1 year or sooner if needed  TESTS ORDERED: 1. Alpha-1 antitrypsin phenotype today 2. Full pulmonary function testing at follow-up appointment 3. Referral to lung cancer screening

## 2016-03-01 ENCOUNTER — Other Ambulatory Visit: Payer: Self-pay | Admitting: Acute Care

## 2016-03-01 DIAGNOSIS — Z87891 Personal history of nicotine dependence: Secondary | ICD-10-CM

## 2016-03-01 LAB — ALPHA-1 ANTITRYPSIN PHENOTYPE: A1 ANTITRYPSIN: 137 mg/dL (ref 83–199)

## 2016-03-15 ENCOUNTER — Encounter: Payer: Self-pay | Admitting: Acute Care

## 2016-03-15 ENCOUNTER — Ambulatory Visit (INDEPENDENT_AMBULATORY_CARE_PROVIDER_SITE_OTHER)
Admission: RE | Admit: 2016-03-15 | Discharge: 2016-03-15 | Disposition: A | Discharge status: Home / Self Care | Payer: Medicare Other | Source: Ambulatory Visit | Attending: Acute Care | Admitting: Acute Care

## 2016-03-15 ENCOUNTER — Ambulatory Visit (INDEPENDENT_AMBULATORY_CARE_PROVIDER_SITE_OTHER): Payer: Medicare Other | Admitting: Acute Care

## 2016-03-15 DIAGNOSIS — Z87891 Personal history of nicotine dependence: Secondary | ICD-10-CM

## 2016-03-15 NOTE — Progress Notes (Signed)
Shared Decision Making Visit Lung Cancer Screening Program [phone])   Eligibility:  Age 70 y.o.  Pack Years Smoking History Calculation 79 pack year smoking history (# packs/per year x # years smoked)  Recent History of coughing up blood  no  Unexplained weight loss? no ( >Than 15 pounds within the last 6 months )  Prior History Lung / other cancer no (Diagnosis within the last 5 years already requiring surveillance chest CT Scans).  Smoking Status Former  Former Smokers: Years since quit: < 1 year  Quit Date: 01/2016  Visit Components:  Discussion included one or more decision making aids. yes  Discussion included risk/benefits of screening. yes  Discussion included potential follow up diagnostic testing for abnormal scans. yes  Discussion included meaning and risk of over diagnosis. yes  Discussion included meaning and risk of False Positives. yes  Discussion included meaning of total radiation exposure. yes  Counseling Included:  Importance of adherence to annual lung cancer LDCT screening. yes  Impact of comorbidities on ability to participate in the program. yes  Ability and willingness to under diagnostic treatment. yes  Smoking Cessation Counseling:  Current Smokers:   Discussed importance of smoking cessation. Not applicable former smoker  Information about tobacco cessation classes and interventions provided to patient. Not applicable former smoker  Patient provided with "ticket" for LDCT Scan. yes  Symptomatic Patient. no  Counseling not applicable former smoker  Diagnosis Code: Tobacco Use Z72.0  Asymptomatic Patient yes  Counseling (Intermediate counseling: > three minutes counseling) [id number]  Former Smokers:   Discussed the importance of maintaining cigarette abstinence. yes  Diagnosis Code: Personal History of Nicotine Dependence. [id number]  Information about tobacco cessation classes and interventions provided to patient. Yes  Patient  provided with "ticket" for LDCT Scan. yes  Written Order for Lung Cancer Screening with LDCT placed in Epic. Yes (CT Chest Lung Cancer Screening Low Dose W/O CM) [id number] Z12.2-Screening of respiratory organs Z87.891-Personal history of nicotine dependence  I spent 15 minutes of face to face time with Billy Lawson discussing the risks and benefits of lung cancer screening. We viewed a power point together that explained in detail the above noted topics. We took the time to pause the power point at intervals to allow for questions to be asked and answered to ensure understanding. We discussed that he had taken the single most powerful action possible to decrease his risk of developing lung cancer when he quit smoking. I counseled Billy Lawson to remain smoke free, and to contact me if he ever had the desire to smoke again so that I can provide resources and tools to help support the effort to remain smoke free. We discussed the time and location of the scan, and that either [place] or I will call with the results within  24-48 hours of receiving them. He has my card and contact information in the event he needs to speak with me, in addition to a copy of the power point we reviewed as a resource. Billy Lawson verbalized understanding of all of the above and had no further questions upon leaving the office.    Billy Spatz, NP 03/15/2016

## 2016-03-27 ENCOUNTER — Telehealth: Payer: Self-pay | Admitting: Acute Care

## 2016-03-27 NOTE — Telephone Encounter (Signed)
I called to give [name] his low-dose CT screening results. There was no answer. I I left a message with contact information requesting that he call back for the results of his scan. We will attempt 1 further call, and if unable to get in touch with [name] will send a registered letter.

## 2016-04-06 ENCOUNTER — Telehealth: Payer: Self-pay | Admitting: Acute Care

## 2016-04-06 NOTE — Telephone Encounter (Signed)
I left a on the patient's answering machine explaining that his scan was normal and that he does not need a repeat scan for 12 months. I have left the office number in the event he has any further questions.

## 2016-11-02 ENCOUNTER — Telehealth: Payer: Self-pay | Admitting: Pulmonary Disease

## 2016-11-02 NOTE — Telephone Encounter (Signed)
lmtcb for pt.  

## 2016-11-02 NOTE — Telephone Encounter (Signed)
Pt calling to see when he needs to schedule 12 month follow up ct chest.  Pt had this originally through lung cancer screening program- CT chest was done 02/2016 and recs were to repeat ct chest in 1 year.    SG please advise if any changes have been made to recs.  Thanks!

## 2016-11-02 NOTE — Telephone Encounter (Signed)
Pt returning call.Stanley A Dalton ° °

## 2016-11-03 ENCOUNTER — Other Ambulatory Visit: Payer: Self-pay | Admitting: Acute Care

## 2016-11-03 DIAGNOSIS — Z87891 Personal history of nicotine dependence: Secondary | ICD-10-CM

## 2016-11-03 NOTE — Telephone Encounter (Signed)
I called [name] to let him know that he would get a call in February 2018 to schedule his March 2018 CT scan. I explained that the orders in the computer and that it will be scheduled within about a month of being due. I left him the office contact information in the event he had any further questions.

## 2017-02-04 DIAGNOSIS — E785 Hyperlipidemia, unspecified: Secondary | ICD-10-CM | POA: Diagnosis not present

## 2017-02-04 DIAGNOSIS — Z0000 Encounter for general adult medical examination without abnormal findings: Secondary | ICD-10-CM | POA: Diagnosis not present

## 2017-02-04 DIAGNOSIS — E119 Type 2 diabetes mellitus without complications: Secondary | ICD-10-CM | POA: Diagnosis not present

## 2017-02-04 DIAGNOSIS — Z7984 Long term (current) use of oral hypoglycemic drugs: Secondary | ICD-10-CM | POA: Diagnosis not present

## 2017-02-04 DIAGNOSIS — L309 Dermatitis, unspecified: Secondary | ICD-10-CM | POA: Diagnosis not present

## 2017-02-04 DIAGNOSIS — Z125 Encounter for screening for malignant neoplasm of prostate: Secondary | ICD-10-CM | POA: Diagnosis not present

## 2017-02-04 DIAGNOSIS — Z79899 Other long term (current) drug therapy: Secondary | ICD-10-CM | POA: Diagnosis not present

## 2017-02-07 DIAGNOSIS — E119 Type 2 diabetes mellitus without complications: Secondary | ICD-10-CM | POA: Diagnosis not present

## 2017-02-07 DIAGNOSIS — R413 Other amnesia: Secondary | ICD-10-CM | POA: Diagnosis not present

## 2017-02-07 DIAGNOSIS — R21 Rash and other nonspecific skin eruption: Secondary | ICD-10-CM | POA: Diagnosis not present

## 2017-02-07 DIAGNOSIS — R350 Frequency of micturition: Secondary | ICD-10-CM | POA: Diagnosis not present

## 2017-03-16 ENCOUNTER — Ambulatory Visit (INDEPENDENT_AMBULATORY_CARE_PROVIDER_SITE_OTHER)
Admission: RE | Admit: 2017-03-16 | Discharge: 2017-03-16 | Disposition: A | Discharge status: Home / Self Care | Payer: Medicare HMO | Source: Ambulatory Visit | Attending: Acute Care | Admitting: Acute Care

## 2017-03-16 DIAGNOSIS — Z87891 Personal history of nicotine dependence: Secondary | ICD-10-CM

## 2017-03-23 ENCOUNTER — Other Ambulatory Visit: Payer: Self-pay | Admitting: Acute Care

## 2017-03-23 DIAGNOSIS — Z87891 Personal history of nicotine dependence: Secondary | ICD-10-CM

## 2017-04-12 DIAGNOSIS — N401 Enlarged prostate with lower urinary tract symptoms: Secondary | ICD-10-CM | POA: Diagnosis not present

## 2017-04-12 DIAGNOSIS — Z0000 Encounter for general adult medical examination without abnormal findings: Secondary | ICD-10-CM | POA: Diagnosis not present

## 2017-04-12 DIAGNOSIS — Z6833 Body mass index (BMI) 33.0-33.9, adult: Secondary | ICD-10-CM | POA: Diagnosis not present

## 2017-04-12 DIAGNOSIS — Z87891 Personal history of nicotine dependence: Secondary | ICD-10-CM | POA: Diagnosis not present

## 2017-04-12 DIAGNOSIS — M25512 Pain in left shoulder: Secondary | ICD-10-CM | POA: Diagnosis not present

## 2017-04-12 DIAGNOSIS — M545 Low back pain: Secondary | ICD-10-CM | POA: Diagnosis not present

## 2017-04-12 DIAGNOSIS — R300 Dysuria: Secondary | ICD-10-CM | POA: Diagnosis not present

## 2017-04-12 DIAGNOSIS — E669 Obesity, unspecified: Secondary | ICD-10-CM | POA: Diagnosis not present

## 2017-04-25 DIAGNOSIS — M545 Low back pain: Secondary | ICD-10-CM | POA: Diagnosis not present

## 2017-05-10 DIAGNOSIS — R69 Illness, unspecified: Secondary | ICD-10-CM | POA: Diagnosis not present

## 2017-08-15 DIAGNOSIS — E785 Hyperlipidemia, unspecified: Secondary | ICD-10-CM | POA: Diagnosis not present

## 2017-08-15 DIAGNOSIS — Z79899 Other long term (current) drug therapy: Secondary | ICD-10-CM | POA: Diagnosis not present

## 2017-08-15 DIAGNOSIS — E119 Type 2 diabetes mellitus without complications: Secondary | ICD-10-CM | POA: Diagnosis not present

## 2017-08-15 DIAGNOSIS — L309 Dermatitis, unspecified: Secondary | ICD-10-CM | POA: Diagnosis not present

## 2017-08-15 DIAGNOSIS — Z125 Encounter for screening for malignant neoplasm of prostate: Secondary | ICD-10-CM | POA: Diagnosis not present

## 2017-08-15 DIAGNOSIS — Z0000 Encounter for general adult medical examination without abnormal findings: Secondary | ICD-10-CM | POA: Diagnosis not present

## 2017-08-16 DIAGNOSIS — Z125 Encounter for screening for malignant neoplasm of prostate: Secondary | ICD-10-CM | POA: Diagnosis not present

## 2017-08-16 DIAGNOSIS — E785 Hyperlipidemia, unspecified: Secondary | ICD-10-CM | POA: Diagnosis not present

## 2017-08-16 DIAGNOSIS — E119 Type 2 diabetes mellitus without complications: Secondary | ICD-10-CM | POA: Diagnosis not present

## 2017-08-16 DIAGNOSIS — Z0000 Encounter for general adult medical examination without abnormal findings: Secondary | ICD-10-CM | POA: Diagnosis not present

## 2017-08-25 DIAGNOSIS — R69 Illness, unspecified: Secondary | ICD-10-CM | POA: Diagnosis not present

## 2017-08-26 DIAGNOSIS — R69 Illness, unspecified: Secondary | ICD-10-CM | POA: Diagnosis not present

## 2017-09-26 DIAGNOSIS — R69 Illness, unspecified: Secondary | ICD-10-CM | POA: Diagnosis not present

## 2017-11-15 DIAGNOSIS — H2513 Age-related nuclear cataract, bilateral: Secondary | ICD-10-CM | POA: Diagnosis not present

## 2017-11-15 DIAGNOSIS — E119 Type 2 diabetes mellitus without complications: Secondary | ICD-10-CM | POA: Diagnosis not present

## 2017-11-15 DIAGNOSIS — H11131 Conjunctival pigmentations, right eye: Secondary | ICD-10-CM | POA: Diagnosis not present

## 2017-11-15 DIAGNOSIS — H33191 Other retinoschisis and retinal cysts, right eye: Secondary | ICD-10-CM | POA: Diagnosis not present

## 2017-11-16 DIAGNOSIS — R69 Illness, unspecified: Secondary | ICD-10-CM | POA: Diagnosis not present

## 2018-02-14 DIAGNOSIS — Z125 Encounter for screening for malignant neoplasm of prostate: Secondary | ICD-10-CM | POA: Diagnosis not present

## 2018-02-14 DIAGNOSIS — E785 Hyperlipidemia, unspecified: Secondary | ICD-10-CM | POA: Diagnosis not present

## 2018-02-14 DIAGNOSIS — Z0000 Encounter for general adult medical examination without abnormal findings: Secondary | ICD-10-CM | POA: Diagnosis not present

## 2018-02-14 DIAGNOSIS — E119 Type 2 diabetes mellitus without complications: Secondary | ICD-10-CM | POA: Diagnosis not present

## 2018-02-15 DIAGNOSIS — M720 Palmar fascial fibromatosis [Dupuytren]: Secondary | ICD-10-CM | POA: Diagnosis not present

## 2018-02-15 DIAGNOSIS — N139 Obstructive and reflux uropathy, unspecified: Secondary | ICD-10-CM | POA: Diagnosis not present

## 2018-02-15 DIAGNOSIS — E785 Hyperlipidemia, unspecified: Secondary | ICD-10-CM | POA: Diagnosis not present

## 2018-02-15 DIAGNOSIS — E119 Type 2 diabetes mellitus without complications: Secondary | ICD-10-CM | POA: Diagnosis not present

## 2018-03-17 ENCOUNTER — Ambulatory Visit (INDEPENDENT_AMBULATORY_CARE_PROVIDER_SITE_OTHER)
Admission: RE | Admit: 2018-03-17 | Discharge: 2018-03-17 | Disposition: A | Discharge status: Home / Self Care | Payer: Medicare HMO | Source: Ambulatory Visit | Attending: Acute Care | Admitting: Acute Care

## 2018-03-17 DIAGNOSIS — Z87891 Personal history of nicotine dependence: Secondary | ICD-10-CM

## 2018-03-20 DIAGNOSIS — R69 Illness, unspecified: Secondary | ICD-10-CM | POA: Diagnosis not present

## 2018-03-24 ENCOUNTER — Telehealth: Payer: Self-pay | Admitting: Acute Care

## 2018-03-24 DIAGNOSIS — Z122 Encounter for screening for malignant neoplasm of respiratory organs: Secondary | ICD-10-CM

## 2018-03-24 DIAGNOSIS — Z87891 Personal history of nicotine dependence: Secondary | ICD-10-CM

## 2018-03-24 NOTE — Telephone Encounter (Signed)
Pt informed of CT results per Sarah Groce, NP.  PT verbalized understanding.  Copy sent to PCP.  Order placed for 1 yr f/u CT.  

## 2018-03-27 DIAGNOSIS — R351 Nocturia: Secondary | ICD-10-CM | POA: Diagnosis not present

## 2018-03-27 DIAGNOSIS — R3121 Asymptomatic microscopic hematuria: Secondary | ICD-10-CM | POA: Diagnosis not present

## 2018-03-27 DIAGNOSIS — N401 Enlarged prostate with lower urinary tract symptoms: Secondary | ICD-10-CM | POA: Diagnosis not present

## 2018-03-27 DIAGNOSIS — R3915 Urgency of urination: Secondary | ICD-10-CM | POA: Diagnosis not present

## 2018-03-31 DIAGNOSIS — R69 Illness, unspecified: Secondary | ICD-10-CM | POA: Diagnosis not present

## 2018-04-20 DIAGNOSIS — N401 Enlarged prostate with lower urinary tract symptoms: Secondary | ICD-10-CM | POA: Diagnosis not present

## 2018-04-20 DIAGNOSIS — R3915 Urgency of urination: Secondary | ICD-10-CM | POA: Diagnosis not present

## 2018-04-20 DIAGNOSIS — R3912 Poor urinary stream: Secondary | ICD-10-CM | POA: Diagnosis not present

## 2018-04-20 DIAGNOSIS — R3121 Asymptomatic microscopic hematuria: Secondary | ICD-10-CM | POA: Diagnosis not present

## 2018-05-04 DIAGNOSIS — M6289 Other specified disorders of muscle: Secondary | ICD-10-CM | POA: Diagnosis not present

## 2018-05-04 DIAGNOSIS — M6281 Muscle weakness (generalized): Secondary | ICD-10-CM | POA: Diagnosis not present

## 2018-05-04 DIAGNOSIS — R350 Frequency of micturition: Secondary | ICD-10-CM | POA: Diagnosis not present

## 2018-05-04 DIAGNOSIS — R351 Nocturia: Secondary | ICD-10-CM | POA: Diagnosis not present

## 2018-05-04 DIAGNOSIS — R3915 Urgency of urination: Secondary | ICD-10-CM | POA: Diagnosis not present

## 2018-05-04 DIAGNOSIS — R3912 Poor urinary stream: Secondary | ICD-10-CM | POA: Diagnosis not present

## 2018-05-16 DIAGNOSIS — M6281 Muscle weakness (generalized): Secondary | ICD-10-CM | POA: Diagnosis not present

## 2018-05-16 DIAGNOSIS — N139 Obstructive and reflux uropathy, unspecified: Secondary | ICD-10-CM | POA: Diagnosis not present

## 2018-05-16 DIAGNOSIS — E785 Hyperlipidemia, unspecified: Secondary | ICD-10-CM | POA: Diagnosis not present

## 2018-05-16 DIAGNOSIS — E119 Type 2 diabetes mellitus without complications: Secondary | ICD-10-CM | POA: Diagnosis not present

## 2018-05-16 DIAGNOSIS — R3912 Poor urinary stream: Secondary | ICD-10-CM | POA: Diagnosis not present

## 2018-05-16 DIAGNOSIS — M6289 Other specified disorders of muscle: Secondary | ICD-10-CM | POA: Diagnosis not present

## 2018-05-16 DIAGNOSIS — R350 Frequency of micturition: Secondary | ICD-10-CM | POA: Diagnosis not present

## 2018-05-16 DIAGNOSIS — R351 Nocturia: Secondary | ICD-10-CM | POA: Diagnosis not present

## 2018-05-17 DIAGNOSIS — E119 Type 2 diabetes mellitus without complications: Secondary | ICD-10-CM | POA: Diagnosis not present

## 2018-05-17 DIAGNOSIS — Z125 Encounter for screening for malignant neoplasm of prostate: Secondary | ICD-10-CM | POA: Diagnosis not present

## 2018-05-17 DIAGNOSIS — R9389 Abnormal findings on diagnostic imaging of other specified body structures: Secondary | ICD-10-CM | POA: Diagnosis not present

## 2018-05-17 DIAGNOSIS — E785 Hyperlipidemia, unspecified: Secondary | ICD-10-CM | POA: Diagnosis not present

## 2018-05-24 DIAGNOSIS — R69 Illness, unspecified: Secondary | ICD-10-CM | POA: Diagnosis not present

## 2018-06-20 DIAGNOSIS — R351 Nocturia: Secondary | ICD-10-CM | POA: Diagnosis not present

## 2018-06-20 DIAGNOSIS — R3912 Poor urinary stream: Secondary | ICD-10-CM | POA: Diagnosis not present

## 2018-06-20 DIAGNOSIS — M6281 Muscle weakness (generalized): Secondary | ICD-10-CM | POA: Diagnosis not present

## 2018-06-20 DIAGNOSIS — R350 Frequency of micturition: Secondary | ICD-10-CM | POA: Diagnosis not present

## 2018-06-20 DIAGNOSIS — M6289 Other specified disorders of muscle: Secondary | ICD-10-CM | POA: Diagnosis not present

## 2018-08-28 DIAGNOSIS — E785 Hyperlipidemia, unspecified: Secondary | ICD-10-CM | POA: Diagnosis not present

## 2018-08-28 DIAGNOSIS — E119 Type 2 diabetes mellitus without complications: Secondary | ICD-10-CM | POA: Diagnosis not present

## 2018-08-28 DIAGNOSIS — Z125 Encounter for screening for malignant neoplasm of prostate: Secondary | ICD-10-CM | POA: Diagnosis not present

## 2018-08-28 DIAGNOSIS — R9389 Abnormal findings on diagnostic imaging of other specified body structures: Secondary | ICD-10-CM | POA: Diagnosis not present

## 2018-08-30 DIAGNOSIS — J302 Other seasonal allergic rhinitis: Secondary | ICD-10-CM | POA: Diagnosis not present

## 2018-08-30 DIAGNOSIS — Z0000 Encounter for general adult medical examination without abnormal findings: Secondary | ICD-10-CM | POA: Diagnosis not present

## 2018-08-30 DIAGNOSIS — E1169 Type 2 diabetes mellitus with other specified complication: Secondary | ICD-10-CM | POA: Diagnosis not present

## 2018-08-30 DIAGNOSIS — J439 Emphysema, unspecified: Secondary | ICD-10-CM | POA: Diagnosis not present

## 2018-08-30 DIAGNOSIS — I700 Atherosclerosis of aorta: Secondary | ICD-10-CM | POA: Diagnosis not present

## 2018-08-30 DIAGNOSIS — E785 Hyperlipidemia, unspecified: Secondary | ICD-10-CM | POA: Diagnosis not present

## 2018-08-30 DIAGNOSIS — L309 Dermatitis, unspecified: Secondary | ICD-10-CM | POA: Diagnosis not present

## 2018-12-25 DIAGNOSIS — R69 Illness, unspecified: Secondary | ICD-10-CM | POA: Diagnosis not present

## 2019-01-10 DIAGNOSIS — R69 Illness, unspecified: Secondary | ICD-10-CM | POA: Diagnosis not present

## 2019-01-17 DIAGNOSIS — H2513 Age-related nuclear cataract, bilateral: Secondary | ICD-10-CM | POA: Diagnosis not present

## 2019-01-17 DIAGNOSIS — E119 Type 2 diabetes mellitus without complications: Secondary | ICD-10-CM | POA: Diagnosis not present

## 2019-01-17 DIAGNOSIS — H11131 Conjunctival pigmentations, right eye: Secondary | ICD-10-CM | POA: Diagnosis not present

## 2019-01-17 DIAGNOSIS — H33191 Other retinoschisis and retinal cysts, right eye: Secondary | ICD-10-CM | POA: Diagnosis not present

## 2019-02-06 ENCOUNTER — Encounter (HOSPITAL_COMMUNITY): Payer: Self-pay

## 2019-02-06 ENCOUNTER — Other Ambulatory Visit: Payer: Self-pay

## 2019-02-06 ENCOUNTER — Emergency Department (HOSPITAL_COMMUNITY): Payer: Medicare HMO

## 2019-02-06 ENCOUNTER — Inpatient Hospital Stay (HOSPITAL_COMMUNITY)
Admission: EM | Admit: 2019-02-06 | Discharge: 2019-02-08 | DRG: 246 | Disposition: A | Discharge status: Home / Self Care | Payer: Medicare HMO | Attending: Interventional Cardiology | Admitting: Interventional Cardiology

## 2019-02-06 DIAGNOSIS — I110 Hypertensive heart disease with heart failure: Secondary | ICD-10-CM | POA: Diagnosis present

## 2019-02-06 DIAGNOSIS — R69 Illness, unspecified: Secondary | ICD-10-CM | POA: Diagnosis not present

## 2019-02-06 DIAGNOSIS — Z87891 Personal history of nicotine dependence: Secondary | ICD-10-CM | POA: Diagnosis not present

## 2019-02-06 DIAGNOSIS — J449 Chronic obstructive pulmonary disease, unspecified: Secondary | ICD-10-CM | POA: Diagnosis present

## 2019-02-06 DIAGNOSIS — Z955 Presence of coronary angioplasty implant and graft: Secondary | ICD-10-CM

## 2019-02-06 DIAGNOSIS — Z23 Encounter for immunization: Secondary | ICD-10-CM

## 2019-02-06 DIAGNOSIS — Z6824 Body mass index (BMI) 24.0-24.9, adult: Secondary | ICD-10-CM | POA: Diagnosis not present

## 2019-02-06 DIAGNOSIS — I214 Non-ST elevation (NSTEMI) myocardial infarction: Secondary | ICD-10-CM | POA: Diagnosis not present

## 2019-02-06 DIAGNOSIS — R079 Chest pain, unspecified: Secondary | ICD-10-CM | POA: Diagnosis not present

## 2019-02-06 DIAGNOSIS — Z79899 Other long term (current) drug therapy: Secondary | ICD-10-CM | POA: Diagnosis not present

## 2019-02-06 DIAGNOSIS — I5041 Acute combined systolic (congestive) and diastolic (congestive) heart failure: Secondary | ICD-10-CM | POA: Diagnosis not present

## 2019-02-06 DIAGNOSIS — R931 Abnormal findings on diagnostic imaging of heart and coronary circulation: Secondary | ICD-10-CM | POA: Diagnosis not present

## 2019-02-06 DIAGNOSIS — R0602 Shortness of breath: Secondary | ICD-10-CM | POA: Diagnosis not present

## 2019-02-06 DIAGNOSIS — E119 Type 2 diabetes mellitus without complications: Secondary | ICD-10-CM | POA: Diagnosis not present

## 2019-02-06 DIAGNOSIS — E785 Hyperlipidemia, unspecified: Secondary | ICD-10-CM | POA: Diagnosis present

## 2019-02-06 DIAGNOSIS — I5021 Acute systolic (congestive) heart failure: Secondary | ICD-10-CM | POA: Diagnosis not present

## 2019-02-06 DIAGNOSIS — F419 Anxiety disorder, unspecified: Secondary | ICD-10-CM | POA: Diagnosis present

## 2019-02-06 DIAGNOSIS — I10 Essential (primary) hypertension: Secondary | ICD-10-CM

## 2019-02-06 DIAGNOSIS — F329 Major depressive disorder, single episode, unspecified: Secondary | ICD-10-CM | POA: Diagnosis present

## 2019-02-06 DIAGNOSIS — Z833 Family history of diabetes mellitus: Secondary | ICD-10-CM

## 2019-02-06 DIAGNOSIS — Z8249 Family history of ischemic heart disease and other diseases of the circulatory system: Secondary | ICD-10-CM

## 2019-02-06 DIAGNOSIS — R0789 Other chest pain: Secondary | ICD-10-CM | POA: Diagnosis not present

## 2019-02-06 HISTORY — DX: Atherosclerotic heart disease of native coronary artery without angina pectoris: I25.10

## 2019-02-06 LAB — I-STAT TROPONIN, ED: Troponin i, poc: 2.52 ng/mL (ref 0.00–0.08)

## 2019-02-06 LAB — BASIC METABOLIC PANEL
Anion gap: 9 (ref 5–15)
BUN: 12 mg/dL (ref 8–23)
CO2: 27 mmol/L (ref 22–32)
CREATININE: 1.12 mg/dL (ref 0.61–1.24)
Calcium: 10.2 mg/dL (ref 8.9–10.3)
Chloride: 103 mmol/L (ref 98–111)
GFR calc Af Amer: >60 mL/min (ref >60)
GFR calc non Af Amer: >60 mL/min (ref >60)
Glucose, Bld: 143 mg/dL — ABNORMAL HIGH (ref 70–99)
Potassium: 4.4 mmol/L (ref 3.5–5.1)
Sodium: 139 mmol/L (ref 135–145)

## 2019-02-06 LAB — CBC
HCT: 47.7 % (ref 39.0–52.0)
Hemoglobin: 15.4 g/dL (ref 13.0–17.0)
MCH: 27.6 pg (ref 26.0–34.0)
MCHC: 32.3 g/dL (ref 30.0–36.0)
MCV: 85.5 fL (ref 80.0–100.0)
Platelets: 189 10*3/uL (ref 150–400)
RBC: 5.58 MIL/uL (ref 4.22–5.81)
RDW: 13 % (ref 11.5–15.5)
WBC: 8.2 10*3/uL (ref 4.0–10.5)
nRBC: 0 % (ref 0.0–0.2)

## 2019-02-06 LAB — TROPONIN I: Troponin I: 3.32 ng/mL (ref <0.03)

## 2019-02-06 LAB — TSH: TSH: 2.205 u[IU]/mL (ref 0.350–4.500)

## 2019-02-06 MED ORDER — INSULIN ASPART 100 UNIT/ML ~~LOC~~ SOLN: 0.0000 [IU] | Freq: Every day | SUBCUTANEOUS | Status: DC

## 2019-02-06 MED ORDER — ATORVASTATIN CALCIUM 80 MG PO TABS
80.0000 mg | ORAL_TABLET | Freq: Every day | ORAL | Status: DC
  Administered 2019-02-07: 80 mg via ORAL
  Filled 2019-02-06: qty 1

## 2019-02-06 MED ORDER — SODIUM CHLORIDE 0.9 % IV SOLN: 250.0000 mL | INTRAVENOUS | Status: DC | PRN

## 2019-02-06 MED ORDER — SODIUM CHLORIDE 0.9% FLUSH
3.0000 mL | Freq: Two times a day (BID) | INTRAVENOUS | Status: DC
  Administered 2019-02-07: 3 mL via INTRAVENOUS

## 2019-02-06 MED ORDER — NITROGLYCERIN 0.4 MG SL SUBL: 0.4000 mg | SUBLINGUAL_TABLET | SUBLINGUAL | Status: DC | PRN

## 2019-02-06 MED ORDER — ASPIRIN EC 81 MG PO TBEC
81.0000 mg | DELAYED_RELEASE_TABLET | Freq: Every day | ORAL | Status: DC
  Administered 2019-02-08: 81 mg via ORAL
  Filled 2019-02-06 (×2): qty 1

## 2019-02-06 MED ORDER — INSULIN ASPART 100 UNIT/ML ~~LOC~~ SOLN
0.0000 [IU] | Freq: Three times a day (TID) | SUBCUTANEOUS | Status: DC
  Administered 2019-02-07 – 2019-02-08 (×3): 1 [IU] via SUBCUTANEOUS

## 2019-02-06 MED ORDER — SODIUM CHLORIDE 0.9 % WEIGHT BASED INFUSION
1.0000 mL/kg/h | INTRAVENOUS | Status: DC
  Administered 2019-02-07: 1 mL/kg/h via INTRAVENOUS

## 2019-02-06 MED ORDER — ONDANSETRON HCL 4 MG/2ML IJ SOLN
4.0000 mg | Freq: Once | INTRAMUSCULAR | Status: AC
  Administered 2019-02-06: 4 mg via INTRAVENOUS
  Filled 2019-02-06: qty 2

## 2019-02-06 MED ORDER — METHYLPREDNISOLONE SODIUM SUCC 125 MG IJ SOLR
80.0000 mg | Freq: Once | INTRAMUSCULAR | Status: AC
  Administered 2019-02-06: 80 mg via INTRAVENOUS
  Filled 2019-02-06: qty 2

## 2019-02-06 MED ORDER — SODIUM CHLORIDE 0.9 % IV SOLN
INTRAVENOUS | Status: DC
  Administered 2019-02-06: 17:00:00 via INTRAVENOUS

## 2019-02-06 MED ORDER — ASPIRIN 81 MG PO CHEW
81.0000 mg | CHEWABLE_TABLET | ORAL | Status: AC
  Administered 2019-02-07: 81 mg via ORAL
  Filled 2019-02-06: qty 1

## 2019-02-06 MED ORDER — ASPIRIN 81 MG PO CHEW
324.0000 mg | CHEWABLE_TABLET | Freq: Once | ORAL | Status: AC
  Administered 2019-02-06: 324 mg via ORAL
  Filled 2019-02-06: qty 4

## 2019-02-06 MED ORDER — SODIUM CHLORIDE 0.9% FLUSH
3.0000 mL | Freq: Once | INTRAVENOUS | Status: AC
  Administered 2019-02-06: 3 mL via INTRAVENOUS

## 2019-02-06 MED ORDER — FAMOTIDINE IN NACL 20-0.9 MG/50ML-% IV SOLN
20.0000 mg | Freq: Once | INTRAVENOUS | Status: AC
  Administered 2019-02-06: 20 mg via INTRAVENOUS
  Filled 2019-02-06: qty 50

## 2019-02-06 MED ORDER — DIPHENHYDRAMINE HCL 50 MG/ML IJ SOLN
25.0000 mg | Freq: Once | INTRAMUSCULAR | Status: AC
  Administered 2019-02-06: 25 mg via INTRAVENOUS
  Filled 2019-02-06: qty 1

## 2019-02-06 MED ORDER — ONDANSETRON HCL 4 MG/2ML IJ SOLN: 4.0000 mg | Freq: Four times a day (QID) | INTRAMUSCULAR | Status: DC | PRN

## 2019-02-06 MED ORDER — CARVEDILOL 3.125 MG PO TABS
3.1250 mg | ORAL_TABLET | Freq: Two times a day (BID) | ORAL | Status: DC
  Administered 2019-02-06 – 2019-02-08 (×4): 3.125 mg via ORAL
  Filled 2019-02-06 (×5): qty 1

## 2019-02-06 MED ORDER — HEPARIN (PORCINE) 25000 UT/250ML-% IV SOLN
1000.0000 [IU]/h | INTRAVENOUS | Status: DC
  Administered 2019-02-06: 900 [IU]/h via INTRAVENOUS
  Filled 2019-02-06: qty 250

## 2019-02-06 MED ORDER — ACETAMINOPHEN 325 MG PO TABS
650.0000 mg | ORAL_TABLET | ORAL | Status: DC | PRN
  Administered 2019-02-07: 650 mg via ORAL
  Filled 2019-02-06: qty 2

## 2019-02-06 MED ORDER — MORPHINE SULFATE (PF) 4 MG/ML IV SOLN
4.0000 mg | Freq: Once | INTRAVENOUS | Status: AC
  Administered 2019-02-06: 4 mg via INTRAVENOUS
  Filled 2019-02-06: qty 1

## 2019-02-06 MED ORDER — SODIUM CHLORIDE 0.9% FLUSH: 3.0000 mL | INTRAVENOUS | Status: DC | PRN

## 2019-02-06 MED ORDER — HEPARIN BOLUS VIA INFUSION
4000.0000 [IU] | Freq: Once | INTRAVENOUS | Status: AC
  Administered 2019-02-06: 4000 [IU] via INTRAVENOUS
  Filled 2019-02-06: qty 4000

## 2019-02-06 MED ORDER — NITROGLYCERIN IN D5W 200-5 MCG/ML-% IV SOLN
0.0000 ug/min | INTRAVENOUS | Status: DC
  Administered 2019-02-06: 5 ug/min via INTRAVENOUS
  Filled 2019-02-06: qty 250

## 2019-02-06 NOTE — ED Notes (Signed)
CRITICAL I-STAT TROPONIN RESULTS REPORTED TO Parke Simmers, MD via face to face and Kayla, RN via phone @1515    I-stat troponin results: 2.52

## 2019-02-06 NOTE — ED Provider Notes (Signed)
[facility] EMERGENCY DEPARTMENT Provider Note   CSN: [id number] Arrival date & time: 02/06/19  1413    History   Chief Complaint Chief Complaint  Patient presents with  . Chest Pain    HPI Billy Lawson is a 73 y.o. male.     Patient with hx dm, elev chol, c/o recurrent mid chest pain/pressure onset approximately 1 week ago. Symptoms episodic, acute onset, usu w minimal exertion but also at rest, mod-severe, non radiating, +generalized weakness and sob. No tearing or ripping pain. No pleuritic pain. Symptoms intermittent, not constant. Indicates pain was particularly bothersome a few days ago, and again last night. Currently w mild pressure mid chest. No hx cad. Does note was told cardiac calcifications noted on prior CT. Former smoker. No leg pain or swelling. No cough or uri symptoms. No fever or chills.   The history is provided by the patient.  Chest Pain  Associated symptoms: no abdominal pain, no back pain, no fever, no headache, no shortness of breath and no vomiting     Past Medical History:  Diagnosis Date  . Allergic rhinitis   . COPD (chronic obstructive pulmonary disease) ([place])   . Diabetes mellitus without complication ([place])   . Hyperlipidemia   . Pneumonia    once in his 70s & was hospitalized without intubation    Patient Active Problem List   Diagnosis Date Noted  . COPD, mild ([place]) 02/25/2016  . Allergic rhinitis 02/25/2016  . Depression with anxiety 03/03/2012  . NICOTINE ADDICTION 01/03/2009    Past Surgical History:  Procedure Laterality Date  . [place] Medications    Prior to Admission medications   Medication Sig Start Date End Date Taking? Authorizing Provider  Red Yeast Rice 600 MG CAPS Take 1 capsule by mouth daily.     [provider]    Family History Family History  Problem Relation Age of Onset  . Lymphoma Father   . Hypertension Mother   . Diabetes Mother    . Cancer Brother        Tonsil  . Diabetes Paternal Grandmother   . Aortic aneurysm Paternal Grandfather   . Lung disease Neg Hx     Social History Social History   Tobacco Use  . Smoking status: Former Smoker    Packs/day: 1.45    Years: 54.00    Pack years: 78.30    Types: Cigarettes    Start date: 12/21/1963    Last attempt to quit: 01/21/2016    Years since quitting: 3.0  . Smokeless tobacco: Never Used  . Tobacco comment: smoking approx 3 cigarettes every 2-3 weeks - Quit for 1 year - Peak rate of 2.5ppd  Substance Use Topics  . Alcohol use: No    Alcohol/week: 0.0 standard drinks    Comment: Recovering alcoholic   . Drug use: No    Comment: Remote marijuana     Allergies   Patient has no known allergies.   Review of Systems Review of Systems  Constitutional: Negative for fever.  HENT: Negative for sore throat.   Eyes: Negative for redness.  Respiratory: Negative for shortness of breath.   Cardiovascular: Positive for chest pain.  Gastrointestinal: Negative for abdominal pain and vomiting.  Endocrine: Negative for polyuria.  Genitourinary: Negative for flank pain.  Musculoskeletal: Negative for back pain and neck pain.  Skin: Negative for rash.  Neurological: Negative for headaches.  Hematological: Does not bruise/bleed easily.  Psychiatric/Behavioral: Negative for confusion.     Physical Exam Updated Vital Signs BP (!) 176/91 (BP Location: Left Arm)   Pulse 76   Temp (!) 97.4 F (36.3 C) (Oral)   Resp 18   SpO2 100%   Physical Exam Vitals signs and nursing note reviewed.  Constitutional:      Appearance: Normal appearance. He is well-developed.  HENT:     Head: Atraumatic.     Nose: Nose normal.     Mouth/Throat:     Mouth: Mucous membranes are moist.     Pharynx: Oropharynx is clear.  Eyes:     General: No scleral icterus.    Conjunctiva/sclera: Conjunctivae normal.     Pupils: Pupils are equal, round, and reactive to light.  Neck:      Musculoskeletal: Normal range of motion and neck supple. No neck rigidity.     Trachea: No tracheal deviation.  Cardiovascular:     Rate and Rhythm: Normal rate and regular rhythm.     Pulses: Normal pulses.     Heart sounds: Normal heart sounds. No murmur. No friction rub. No gallop.   Pulmonary:     Effort: Pulmonary effort is normal. No accessory muscle usage or respiratory distress.     Breath sounds: Normal breath sounds.  Chest:     Chest wall: No tenderness.  Abdominal:     General: Bowel sounds are normal. There is no distension.     Palpations: Abdomen is soft.     Tenderness: There is no abdominal tenderness. There is no guarding.  Genitourinary:    Comments: No cva tenderness. Musculoskeletal:        General: No swelling or tenderness.  Skin:    General: Skin is warm and dry.     Findings: No rash.  Neurological:     Mental Status: He is alert.     Comments: Alert, speech clear.   Psychiatric:        Mood and Affect: Mood normal.      ED Treatments / Results  Labs (all labs ordered are listed, but only abnormal results are displayed) Results for orders placed or performed during the hospital encounter of 57/26/20  Basic metabolic panel  Result Value Ref Range   Sodium 139 135 - 145 mmol/L   Potassium 4.4 3.5 - 5.1 mmol/L   Chloride 103 98 - 111 mmol/L   CO2 27 22 - 32 mmol/L   Glucose, Bld 143 (H) 70 - 99 mg/dL   BUN 12 8 - 23 mg/dL   Creatinine, Ser 1.12 0.61 - 1.24 mg/dL   Calcium 10.2 8.9 - 10.3 mg/dL   GFR calc non Af Amer >60 >60 mL/min   GFR calc Af Amer >60 >60 mL/min   Anion gap 9 5 - 15  CBC  Result Value Ref Range   WBC 8.2 4.0 - 10.5 K/uL   RBC 5.58 4.22 - 5.81 MIL/uL   Hemoglobin 15.4 13.0 - 17.0 g/dL   HCT 47.7 39.0 - 52.0 %   MCV 85.5 80.0 - 100.0 fL   MCH 27.6 26.0 - 34.0 pg   MCHC 32.3 30.0 - 36.0 g/dL   RDW 13.0 11.5 - 15.5 %   Platelets 189 150 - 400 K/uL   nRBC 0.0 0.0 - 0.2 %  I-stat troponin, ED  Result Value Ref Range    Troponin i, poc 2.52 (HH) 0.00 - 0.08 ng/mL   Comment NOTIFIED PHYSICIAN  Comment 3           Dg Chest 2 View  Result Date: 02/06/2019 CLINICAL DATA:  Mid chest pain radiating around to LEFT upper chest and arm sometimes into shoulders and up into RIGHT neck, fatigue, lightheadedness, shortness of breath, history COPD, diabetes mellitus, former smoker EXAM: CHEST - 2 VIEW COMPARISON:  07/14/2011 FINDINGS: Normal heart size, mediastinal contours, and pulmonary vascularity. Atherosclerotic calcification aorta. Lungs clear. No pulmonary infiltrate, pleural effusion or pneumothorax. Bones unremarkable. IMPRESSION: No acute abnormalities. Electronically Signed   By: Lavonia Dana M.D.   On: 02/06/2019 15:23    EKG EKG Interpretation  Date/Time:  Tuesday February 06 2019 14:18:40 EST Ventricular Rate:  72 PR Interval:  204 QRS Duration: 92 QT Interval:  394 QTC Calculation: 431 R Axis:   72 Text Interpretation:  Normal sinus rhythm T wave inversion Anterolateral leads No previous tracing Confirmed by Lajean Saver [phone]) on 02/06/2019 3:46:38 PM   Radiology Dg Chest 2 View  Result Date: 02/06/2019 CLINICAL DATA:  Mid chest pain radiating around to LEFT upper chest and arm sometimes into shoulders and up into RIGHT neck, fatigue, lightheadedness, shortness of breath, history COPD, diabetes mellitus, former smoker EXAM: CHEST - 2 VIEW COMPARISON:  07/14/2011 FINDINGS: Normal heart size, mediastinal contours, and pulmonary vascularity. Atherosclerotic calcification aorta. Lungs clear. No pulmonary infiltrate, pleural effusion or pneumothorax. Bones unremarkable. IMPRESSION: No acute abnormalities. Electronically Signed   By: Lavonia Dana M.D.   On: 02/06/2019 15:23    Procedures Procedures (including critical care time)  Medications Ordered in ED Medications  sodium chloride flush (NS) 0.9 % injection 3 mL (has no administration in time range)  0.9 %  sodium chloride infusion (has no  administration in time range)  aspirin chewable tablet 324 mg (has no administration in time range)     Initial Impression / Assessment and Plan / ED Course  I have reviewed the triage vital signs and the nursing notes.  Pertinent labs & imaging results that were available during my care of the patient were reviewed by me and considered in my medical decision making (see chart for details).  Iv ns. Continuous pulse ox and monitor. o2 [place]. Labs. Cxr. Ecg.  Asa po.  Reviewed nursing notes and prior charts for additional history.   Labs reviewed - trop is very high. Cardiology stat consulted re nstemi.   Heparin iv per pharmacy consult.   Morphine iv. zofran iv.   Repeat ecg.   Discussed with cardiology - they will be right down to see/admit.  cxr reviewed - no pna.   Pain improved from prior.   CRITICAL CARE RE; ACS, NSTEMI, elev troponin.  Performed by: Mirna Mires Total critical care time: 35 minutes Critical care time was exclusive of separately billable procedures and treating other patients. Critical care was necessary to treat or prevent imminent or life-threatening deterioration. Critical care was time spent personally by me on the following activities: development of treatment plan with patient and/or surrogate as well as nursing, discussions with consultants, evaluation of patient's response to treatment, examination of patient, obtaining history from patient or surrogate, ordering and performing treatments and interventions, ordering and review of laboratory studies, ordering and review of radiographic studies, pulse oximetry and re-evaluation of patient's condition.   Final Clinical Impressions(s) / ED Diagnoses   Final diagnoses:  NSTEMI (non-ST elevated myocardial infarction) [facility])    ED Discharge Orders    None       Lajean Saver,  MD 02/06/19 1631

## 2019-02-06 NOTE — H&P (Addendum)
The patient has been seen in conjunction with Ina Homes, MD. All aspects of care have been considered and discussed. The patient has been personally interviewed, examined, and all clinical data has been reviewed.   The clinical data are consistent with NSTEMI and likely multivessel CAD.  Plan IV heparin, IV NTG, and early coronary angiography to define anatomy and guide therapy.   CARDIOLOGY HISTORY AND PHYSICAL   Patient ID: Billy Lawson MRN: [id number]  DOB/AGE: 09-16-1946 73 y.o. Admit date: 02/06/2019  Primary Care Physician: Lawerance Cruel, MD Primary Cardiologist: None  Clinical Summary Billy Lawson is a 73 y.o.male with hyperlipidemia and diabetes mellitus who presented to the emergency department with substernal chest pain. Patient states that approximately 10 days ago he experienced severe sharp/pressure like substernal chest pain that radiated into his neck and into his left arm. This lasted approximately 20 minutes and then abated without any intervention. Over the next several days he experienced intermittent episodes that did not seem as severe but continued to last 15 to 20 minutes in duration. He states that after each episode he felt extremely tired and short of breath. His last episode was last night around 11 PM and again lasted approximately 20 minutes. He initially thought these episodes were related to indigestion because they seem to correlate with food intake. He is never had anything like this before. He does not notice any change with deep inspiration. His pain does seem to be positional and he states that during the pain it is worse in the supine position in significantly improved when upright and leaning forward.  At baseline he is typically active and able to do all his ADLs without shortness of breath. He does tell me that he has some shortness of breath when he is exerting himself hard. This is been going on for the last 1 to 2 years and  he has subsequently decreased the pace at which he completes tasks to accommodate the shortness of breath. He is a previous smoker and quit 2 to 3 years ago. Prior to this he was smoking at least two packs per day for over 30 years. He is a prior heavy alcohol user with his last drink being in 2001. He denies use of recreational drugs. He is no family history of heart disease. He has never had a stress test but did say that he had calcifications around his heart on previous chest x-ray.  He denies fevers, rhinorrhea, headache, myalgias, arthralgias, orthopnea, PND, increased abdominal girth, decreased appetite, nausea/vomiting, decreased urine output, lower extremity edema. He has noted mild cough and chills.  No Known Allergies  Home Medications Scheduled Meds: . heparin  4,000 Units Intravenous Once  . sodium chloride flush  3 mL Intravenous Once   Continuous Infusions: . sodium chloride    . heparin     Scheduled Medications . heparin  4,000 Units Intravenous Once  . sodium chloride flush  3 mL Intravenous Once     Infusions . sodium chloride    . heparin       Past Medical History:  Diagnosis Date  . Allergic rhinitis   . COPD (chronic obstructive pulmonary disease) ([place])   . Diabetes mellitus without complication ([place])   . Hyperlipidemia   . Pneumonia    once in his 66s & was hospitalized without intubation   Past Surgical History:  Procedure Laterality Date  . ACHILLES TENDON SURGERY  1993   Family History  Problem Relation Age  of Onset  . Lymphoma Father   . Hypertension Mother   . Diabetes Mother   . Cancer Brother        Tons[place]  . Diabetes Paternal Grandmother   . Aortic aneurysm Paternal Grandfather   . Lung disease Neg Hx    Social History Billy Lawson reports that he quit smoking about 3 years ago. His smoking use included cigarettes. He started smoking about 55 years ago. He has a 78.30 pack-year smoking history. He has never used smokeless  tobacco. Billy Lawson reports no history of alcohol use.  Review of Systems Otherwise reviewed and negative except as outlined.  Physical Examination Temp:  [97.4 F (36.3 C)] 97.4 F (36.3 C) (02/18 1422) Pulse Rate:  [76] 76 (02/18 1422) Resp:  [18] 18 (02/18 1422) BP: (176)/(91) 176/91 (02/18 1422) SpO2:  [100 %] 100 % (02/18 1422) Weight:  [77.1 kg] 77.1 kg (02/18 1600) No intake or output data in the 24 hours ending 02/06/19 1640  Gen: No acute distress. HEENT: Conjunctiva and lids normal, oropharynx clear with moist mucosa. Neck: Supple, no elevated JVP or carotid bruits, no thyromegaly. Lungs: Clear to auscultation, nonlabored breathing at rest. Cardiac: Regular rate and rhythm, no S3 or significant systolic murmur, no pericardial rub. Abdomen: Soft, nontender, no hepatomegaly, bowel sounds present, no guarding or rebound. Extremities: No pitting edema, distal pulses 2+. Skin: Warm and dry. Musculoskeletal: No kyphosis. Neuropsychiatric: Alert and oriented x3, affect grossly appropriate.  Lab Results  Basic Metabolic Panel: Recent Labs  Lab 02/06/19 1436  NA 139  K 4.4  CL 103  CO2 27  GLUCOSE 143*  BUN 12  CREATININE 1.12  CALCIUM 10.2   Liver Function Tests: No results for input(s): AST, ALT, ALKPHOS, BILITOT, PROT, ALBUMIN in the last 168 hours.  CBC: Recent Labs  Lab 02/06/19 1436  WBC 8.2  HGB 15.4  HCT 47.7  MCV 85.5  PLT 189   Cardiac Enzymes: No results for input(s): CKTOTAL, CKMB, CKMBINDEX, TROPONINI in the last 168 hours.  BNP: Invalid input(s): [place]  Radiology Dg Chest 2 View  Result Date: 02/06/2019 CLINICAL DATA:  Mid chest pain radiating around to LEFT upper chest and arm sometimes into shoulders and up into RIGHT neck, fatigue, lightheadedness, shortness of breath, history COPD, diabetes mellitus, former smoker EXAM: CHEST - 2 VIEW COMPARISON:  07/14/2011 FINDINGS: Normal heart size, mediastinal contours, and pulmonary  vascularity. Atherosclerotic calcification aorta. Lungs clear. No pulmonary infiltrate, pleural effusion or pneumothorax. Bones unremarkable. IMPRESSION: No acute abnormalities. Electronically Signed   By: Lavonia Dana M.D.   On: 02/06/2019 15:23   Prior Cardiac Testing/Procedures: None  ECG: Sinus rhythm with T-wave immersion in the anterior lateral leads. No new conduction abnormalities. No prior EKGs for comparison.   Impression and Recommendations  Billy Lawson is a 73 y.o.male with hyperlipidemia and diabetes mellitus who presented to the emergency department with substernal chest pain. Found to have anterior lateral T-wave inversion on EKG and a point-of-care troponin of 2.5.  NSTE-ACS - Although patient's description of his chest pain is atypical for ACS his other clinical features are consistent with ACS. Prior CT chest in 03/17/2018 with coronary calcifications of the LAD and RCA. - Troponin elevated to 2.5. Will continue to trend - Start IV heparin - Plan for cath within the next 24 hours. Start pre-cath hydration. - NPO at midnight  - Morphine and nitroglycerin for pain control  - Start carvedilol 3.125 mg BID - Will need  ACE/ARB started prior to discharge - If LVEF <40% will need aldosterone antagonist as well - Echocardiogram after cath   Hypertension - BP grossly elevated at > 190/90 - BP goal 130/80  - Start Carvedilol and will start ACE/ARB s/p cath   HLD - Taking red yeast rice - Check Lipid panel  - Will need to start high intensity statin  DM - Check A1c  - Currently managed with diet and lifestyle changes  - Could consider SGLT-2 inhibitor or GLP-1 for cardioprotective effect  Will discuss the case further with Dr. Tamala Julian.   Signed: Ina Homes, MD 02/06/2019, 4:40 PM

## 2019-02-06 NOTE — ED Notes (Signed)
1st attempt to call report, RN still in shift change report, will call back

## 2019-02-06 NOTE — ED Triage Notes (Signed)
Pt reports he has been having chest pressure that he thought was indigestion X1 week. Pt states last night he had a severe episode. Pt reports pain has been ongoing and was sent here by his PCP for further evaluation.

## 2019-02-06 NOTE — Progress Notes (Signed)
ANTICOAGULATION CONSULT NOTE - Initial Consult  Pharmacy Consult for heparin Indication: chest pain/ACS  No Known Allergies  Patient Measurements:   Heparin Dosing Weight: 77kg  Vital Signs: Temp: 97.4 F (36.3 C) (02/18 1422) Temp Source: Oral (02/18 1422) BP: 176/91 (02/18 1422) Pulse Rate: 76 (02/18 1422)  Labs: Recent Labs    02/06/19 1436  HGB 15.4  HCT 47.7  PLT 189  CREATININE 1.12    CrCl cannot be calculated (Unknown ideal weight.).   Medical History: Past Medical History:  Diagnosis Date  . Allergic rhinitis   . COPD (chronic obstructive pulmonary disease) ([place])   . Diabetes mellitus without complication ([place])   . Hyperlipidemia   . Pneumonia    once in his 16s & was hospitalized without intubation    Assessment: 17 YOM presenting with CP, elevated troponin, pharmacy consulted to start heparin gtt.  No anticoagulation PTA, CBC wnl.    Goal of Therapy:  Heparin level 0.3-0.7 units/ml Monitor platelets by anticoagulation protocol: Yes   Plan:  Heparin 4000 units x 1, and gtt at 900 units/hr F/u 8 hour heparin level  Bertis Ruddy, PharmD Clinical Pharmacist Please check AMION for all [place] numbers 02/06/2019 4:11 PM

## 2019-02-06 NOTE — ED Notes (Signed)
Pt with full body hives after starting nitro gtt.  Gtt DC'd, Dr. Tamala Julian paged. Awaiting call back.  Patient VSS and airway not compromised.

## 2019-02-07 ENCOUNTER — Other Ambulatory Visit: Payer: Self-pay

## 2019-02-07 ENCOUNTER — Encounter (HOSPITAL_COMMUNITY)
Admission: EM | Disposition: A | Discharge status: Home / Self Care | Payer: Self-pay | Source: Home / Self Care | Attending: Interventional Cardiology

## 2019-02-07 ENCOUNTER — Encounter (HOSPITAL_COMMUNITY): Payer: Self-pay | Admitting: General Practice

## 2019-02-07 DIAGNOSIS — E785 Hyperlipidemia, unspecified: Secondary | ICD-10-CM

## 2019-02-07 HISTORY — PX: LEFT HEART CATH AND CORONARY ANGIOGRAPHY: CATH118249

## 2019-02-07 HISTORY — PX: INTRAVASCULAR ULTRASOUND/IVUS: CATH118244

## 2019-02-07 HISTORY — PX: CORONARY STENT INTERVENTION: CATH118234

## 2019-02-07 LAB — CBC
HCT: 40.7 % (ref 39.0–52.0)
Hemoglobin: 13.4 g/dL (ref 13.0–17.0)
MCH: 27.8 pg (ref 26.0–34.0)
MCHC: 32.9 g/dL (ref 30.0–36.0)
MCV: 84.4 fL (ref 80.0–100.0)
Platelets: 172 10*3/uL (ref 150–400)
RBC: 4.82 MIL/uL (ref 4.22–5.81)
RDW: 13.1 % (ref 11.5–15.5)
WBC: 4.4 10*3/uL (ref 4.0–10.5)
nRBC: 0 % (ref 0.0–0.2)

## 2019-02-07 LAB — GLUCOSE, CAPILLARY
Glucose-Capillary: 149 mg/dL — ABNORMAL HIGH (ref 70–99)
Glucose-Capillary: 96 mg/dL (ref 70–99)
Glucose-Capillary: 138 mg/dL — ABNORMAL HIGH (ref 70–99)
Glucose-Capillary: 165 mg/dL — ABNORMAL HIGH (ref 70–99)

## 2019-02-07 LAB — LIPID PANEL
Cholesterol: 174 mg/dL (ref 0–200)
HDL: 45 mg/dL (ref >40)
LDL Cholesterol: 121 mg/dL — ABNORMAL HIGH (ref 0–99)
Total CHOL/HDL Ratio: 3.9 RATIO
Triglycerides: 38 mg/dL (ref <150)
VLDL: 8 mg/dL (ref 0–40)

## 2019-02-07 LAB — CREATININE, SERUM
Creatinine, Ser: 1.17 mg/dL (ref 0.61–1.24)
GFR calc non Af Amer: >60 mL/min (ref >60)

## 2019-02-07 LAB — POCT ACTIVATED CLOTTING TIME
ACTIVATED CLOTTING TIME: 362 s
Activated Clotting Time: 621 seconds
Activated Clotting Time: 224 seconds

## 2019-02-07 LAB — HEMOGLOBIN A1C
Hgb A1c MFr Bld: 7 % — ABNORMAL HIGH (ref 4.8–5.6)
Mean Plasma Glucose: 154 mg/dL

## 2019-02-07 LAB — HEPARIN LEVEL (UNFRACTIONATED): Heparin Unfractionated: 0.3 IU/mL (ref 0.30–0.70)

## 2019-02-07 LAB — TROPONIN I
Troponin I: 1.97 ng/mL (ref <0.03)
Troponin I: 1.14 ng/mL (ref <0.03)

## 2019-02-07 SURGERY — LEFT HEART CATH AND CORONARY ANGIOGRAPHY
Anesthesia: LOCAL

## 2019-02-07 MED ORDER — ANGIOPLASTY BOOK
Freq: Once | Status: AC
  Administered 2019-02-07: 1
  Filled 2019-02-07: qty 1

## 2019-02-07 MED ORDER — ENOXAPARIN SODIUM 40 MG/0.4ML ~~LOC~~ SOLN
40.0000 mg | SUBCUTANEOUS | Status: DC
  Administered 2019-02-08: 40 mg via SUBCUTANEOUS
  Filled 2019-02-07: qty 0.4

## 2019-02-07 MED ORDER — SODIUM CHLORIDE 0.9 % WEIGHT BASED INFUSION
1.0000 mL/kg/h | INTRAVENOUS | Status: AC
  Administered 2019-02-07: 1 mL/kg/h via INTRAVENOUS

## 2019-02-07 MED ORDER — HEPARIN SODIUM (PORCINE) 1000 UNIT/ML IJ SOLN
INTRAMUSCULAR | Status: AC
  Filled 2019-02-07: qty 1

## 2019-02-07 MED ORDER — LIDOCAINE HCL (PF) 1 % IJ SOLN
INTRAMUSCULAR | Status: AC
  Filled 2019-02-07: qty 30

## 2019-02-07 MED ORDER — TICAGRELOR 90 MG PO TABS
ORAL_TABLET | ORAL | Status: DC | PRN
  Administered 2019-02-07: 180 mg via ORAL

## 2019-02-07 MED ORDER — VERAPAMIL HCL 2.5 MG/ML IV SOLN
INTRAVENOUS | Status: AC
  Filled 2019-02-07: qty 2

## 2019-02-07 MED ORDER — NITROGLYCERIN 1 MG/10 ML FOR IR/CATH LAB
INTRA_ARTERIAL | Status: DC | PRN
  Administered 2019-02-07 (×3): 200 ug via INTRACORONARY

## 2019-02-07 MED ORDER — MIDAZOLAM HCL 2 MG/2ML IJ SOLN
INTRAMUSCULAR | Status: DC | PRN
  Administered 2019-02-07: 1 mg via INTRAVENOUS

## 2019-02-07 MED ORDER — HEART ATTACK BOUNCING BOOK
Freq: Once | Status: AC
  Administered 2019-02-07: 21:00:00 1
  Filled 2019-02-07: qty 1

## 2019-02-07 MED ORDER — FENTANYL CITRATE (PF) 100 MCG/2ML IJ SOLN
INTRAMUSCULAR | Status: AC
  Filled 2019-02-07: qty 2

## 2019-02-07 MED ORDER — SODIUM CHLORIDE 0.9% FLUSH: 3.0000 mL | INTRAVENOUS | Status: DC | PRN

## 2019-02-07 MED ORDER — HEPARIN (PORCINE) IN NACL 1000-0.9 UT/500ML-% IV SOLN
INTRAVENOUS | Status: AC
  Filled 2019-02-07: qty 1000

## 2019-02-07 MED ORDER — NITROGLYCERIN 1 MG/10 ML FOR IR/CATH LAB
INTRA_ARTERIAL | Status: AC
  Filled 2019-02-07: qty 10

## 2019-02-07 MED ORDER — TICAGRELOR 90 MG PO TABS
ORAL_TABLET | ORAL | Status: AC
  Filled 2019-02-07: qty 1

## 2019-02-07 MED ORDER — FENTANYL CITRATE (PF) 100 MCG/2ML IJ SOLN
INTRAMUSCULAR | Status: DC | PRN
  Administered 2019-02-07: 50 ug via INTRAVENOUS

## 2019-02-07 MED ORDER — LIDOCAINE HCL (PF) 1 % IJ SOLN
INTRAMUSCULAR | Status: DC | PRN
  Administered 2019-02-07: 2 mL

## 2019-02-07 MED ORDER — INFLUENZA VAC SPLIT HIGH-DOSE 0.5 ML IM SUSY: 0.5000 mL | PREFILLED_SYRINGE | INTRAMUSCULAR | Status: DC | PRN

## 2019-02-07 MED ORDER — HEPARIN (PORCINE) IN NACL 1000-0.9 UT/500ML-% IV SOLN
INTRAVENOUS | Status: DC | PRN
  Administered 2019-02-07 (×2): 500 mL

## 2019-02-07 MED ORDER — VERAPAMIL HCL 2.5 MG/ML IV SOLN
INTRAVENOUS | Status: DC | PRN
  Administered 2019-02-07: 10 mL via INTRA_ARTERIAL

## 2019-02-07 MED ORDER — SODIUM CHLORIDE 0.9 % IV SOLN: 250.0000 mL | INTRAVENOUS | Status: DC | PRN

## 2019-02-07 MED ORDER — IOHEXOL 350 MG/ML SOLN
INTRAVENOUS | Status: DC | PRN
  Administered 2019-02-07: 170 mL via INTRAVENOUS

## 2019-02-07 MED ORDER — TICAGRELOR 90 MG PO TABS
90.0000 mg | ORAL_TABLET | Freq: Two times a day (BID) | ORAL | Status: DC
  Administered 2019-02-07 – 2019-02-08 (×2): 90 mg via ORAL
  Filled 2019-02-07 (×2): qty 1

## 2019-02-07 MED ORDER — MIDAZOLAM HCL 2 MG/2ML IJ SOLN
INTRAMUSCULAR | Status: AC
  Filled 2019-02-07: qty 2

## 2019-02-07 MED ORDER — SODIUM CHLORIDE 0.9% FLUSH
3.0000 mL | Freq: Two times a day (BID) | INTRAVENOUS | Status: DC
  Administered 2019-02-07: 21:00:00 3 mL via INTRAVENOUS

## 2019-02-07 MED ORDER — HEPARIN SODIUM (PORCINE) 1000 UNIT/ML IJ SOLN
INTRAMUSCULAR | Status: DC | PRN
  Administered 2019-02-07 (×2): 4000 [IU] via INTRAVENOUS

## 2019-02-07 SURGICAL SUPPLY — 18 items
BALLN SAPPHIRE 2.0X15 (BALLOONS) ×2
BALLN ~~LOC~~ EMERGE MR 3.0X20 (BALLOONS) ×2
BALLOON SAPPHIRE 2.0X15 (BALLOONS) IMPLANT
BALLOON ~~LOC~~ EMERGE MR 3.0X20 (BALLOONS) IMPLANT
CATH DRAGONFLY OPTIS 2.7FR (CATHETERS) ×1 IMPLANT
CATH INFINITI 5FR JK (CATHETERS) ×1 IMPLANT
CATH LAUNCHER 6FR EBU3.5 (CATHETERS) ×1 IMPLANT
DEVICE RAD COMP TR BAND LRG (VASCULAR PRODUCTS) ×1 IMPLANT
GLIDESHEATH SLEND SS 6F .021 (SHEATH) ×1 IMPLANT
GUIDEWIRE INQWIRE 1.5J.035X260 (WIRE) IMPLANT
INQWIRE 1.5J .035X260CM (WIRE) ×2
KIT ENCORE 26 ADVANTAGE (KITS) ×1 IMPLANT
KIT HEART LEFT (KITS) ×2 IMPLANT
PACK CARDIAC CATHETERIZATION (CUSTOM PROCEDURE TRAY) ×2 IMPLANT
STENT RESOLUTE ONYX 2.75X30 (Permanent Stent) ×1 IMPLANT
TRANSDUCER W/STOPCOCK (MISCELLANEOUS) ×2 IMPLANT
TUBING CIL FLEX 10 FLL-RA (TUBING) ×2 IMPLANT
WIRE RUNTHROUGH .014X180CM (WIRE) ×1 IMPLANT

## 2019-02-07 NOTE — Interval H&P Note (Signed)
Cath Lab Visit (complete for each Cath Lab visit)  Clinical Evaluation Leading to the Procedure:   ACS: Yes.    Non-ACS:  n/a   History and Physical Interval Note:  02/07/2019 10:36 AM  Billy Lawson  has presented today for surgery, with the diagnosis of ns  The various methods of treatment have been discussed with the patient and family. After consideration of risks, benefits and other options for treatment, the patient has consented to  Procedure(s): LEFT HEART CATH AND CORONARY ANGIOGRAPHY (N/A) as a surgical intervention .  The patient's history has been reviewed, patient examined, no change in status, stable for surgery.  I have reviewed the patient's chart and labs.  Questions were answered to the patient's satisfaction.     Kathlyn Sacramento

## 2019-02-07 NOTE — Progress Notes (Signed)
Patient will not be returning to unit post cath. Personal belongings forwarded to [facility]

## 2019-02-07 NOTE — Progress Notes (Addendum)
The patient has been seen in conjunction with Ina Homes, MD. All aspects of care have been considered and discussed. The patient has been personally interviewed, examined, and all clinical data has been reviewed.   The digital images from heart catheterization and percutaneous intervention have been personally reviewed.  Essentially totally occluded proximal to mid LAD treated with a single long stent with excellent angiographic result.  No significant disease in other vascular territory.  Plan dual antiplatelet therapy for 12 months.  Aggressive secondary risk modification: LDL less than 70, aggressive control of blood sugar, phase 1 and 2 cardiac rehab (150 minutes of moderate physical activity per week), consideration of sleep evaluation.  2D echo to formally assess LV systolic function and help guide therapy.  Potential discharge in a.m.   Progress Note  Patient Name: Billy Lawson Date of Encounter: 02/07/2019  Primary Cardiologist: No primary care provider on file.   Subjective   Patient feeling well without recurrence of his CP or SOB. Remains on nitro and heparin ggt. He does not have any questions this AM. Understands plan for left heart cath with possible intervention.   Inpatient Medications    Scheduled Meds: . aspirin EC  81 mg Oral Daily  . atorvastatin  80 mg Oral q1800  . carvedilol  3.125 mg Oral BID WC  . insulin aspart  0-5 Units Subcutaneous QHS  . insulin aspart  0-9 Units Subcutaneous TID WC  . sodium chloride flush  3 mL Intravenous Q12H   Continuous Infusions: . sodium chloride 10 mL/hr at 02/06/19 1647  . sodium chloride    . sodium chloride 1 mL/kg/hr (02/07/19 0051)  . heparin 1,000 Units/hr (02/07/19 0210)  . nitroGLYCERIN 5 mcg/min (02/06/19 1735)   PRN Meds: sodium chloride, acetaminophen, nitroGLYCERIN, ondansetron (ZOFRAN) IV, sodium chloride flush   Vital Signs    Vitals:   02/07/19 0025 02/07/19 0056 02/07/19 0136  02/07/19 0459  BP: 92/62 (!) 104/58  (!) 101/59  Pulse: 62 (!) 54  (!) 59  Resp: 18   20  Temp: 98.4 F (36.9 C)   97.7 F (36.5 C)  TempSrc: Oral   Oral  SpO2: 96%   94%  Weight:   74.4 kg   Height:        Intake/Output Summary (Last 24 hours) at 02/07/2019 0920 Last data filed at 02/07/2019 0551 Gross per 24 hour  Intake 805.18 ml  Output 150 ml  Net 655.18 ml   Filed Weights   02/06/19 1600 02/06/19 2018 02/07/19 0136  Weight: 77.1 kg 74.3 kg 74.4 kg    Telemetry    Sinus brady - Personally Reviewed  ECG    NSR with significant TWI int he anteriolateral leads consistent with Wellen's sign Type B - Personally Reviewed  Physical Exam   Today's Vitals   02/07/19 0025 02/07/19 0056 02/07/19 0136 02/07/19 0459  BP: 92/62 (!) 104/58  (!) 101/59  Pulse: 62 (!) 54  (!) 59  Resp: 18   20  Temp: 98.4 F (36.9 C)   97.7 F (36.5 C)  TempSrc: Oral   Oral  SpO2: 96%   94%  Weight:   74.4 kg   Height:      PainSc:       Body mass index is 23.53 kg/m.  GEN: No acute distress.   Neck: No JVD Cardiac: RRR, no murmurs, rubs, or gallops.  Respiratory: Clear to auscultation bilaterally. GI: Soft, nontender, non-distended  MS: No edema; No  deformity. Neuro:  Nonfocal  Psych: Normal affect   Labs    Chemistry Recent Labs  Lab 02/06/19 1436  NA 139  K 4.4  CL 103  CO2 27  GLUCOSE 143*  BUN 12  CREATININE 1.12  CALCIUM 10.2  GFRNONAA >60  GFRAA >60  ANIONGAP 9    Hematology Recent Labs  Lab 02/06/19 1436 02/07/19 0438  WBC 8.2 4.4  RBC 5.58 4.82  HGB 15.4 13.4  HCT 47.7 40.7  MCV 85.5 84.4  MCH 27.6 27.8  MCHC 32.3 32.9  RDW 13.0 13.1  PLT 189 172   Cardiac Enzymes Recent Labs  Lab 02/06/19 1653 02/06/19 2344 02/07/19 0438  TROPONINI 3.32* 1.97* 1.14*    Recent Labs  Lab 02/06/19 1502  TROPIPOC 2.52*    BNPNo results for input(s): BNP, PROBNP in the last 168 hours.   DDimer No results for input(s): DDIMER in the last 168 hours.    Radiology    Dg Chest 2 View  Result Date: 02/06/2019 CLINICAL DATA:  Mid chest pain radiating around to LEFT upper chest and arm sometimes into shoulders and up into RIGHT neck, fatigue, lightheadedness, shortness of breath, history COPD, diabetes mellitus, former smoker EXAM: CHEST - 2 VIEW COMPARISON:  07/14/2011 FINDINGS: Normal heart size, mediastinal contours, and pulmonary vascularity. Atherosclerotic calcification aorta. Lungs clear. No pulmonary infiltrate, pleural effusion or pneumothorax. Bones unremarkable. IMPRESSION: No acute abnormalities. Electronically Signed   By: Lavonia Dana M.D.   On: 02/06/2019 15:23   Cardiac Studies   None  Patient Profile     [name] is a 73 y.o.male with hyperlipidemia and diabetes mellitus who presented to the emergency department with substernal chest pain. Found to have anterior lateral T-wave inversion on EKG and a point-of-care troponin of 2.5.  Assessment & Plan    NSTE-ACS - No further CP or SOB  - Troponin peaked at 3.3 now down trending  - EKG with significant TWI in the anterior lateral leads consistent with Wellen's sign  - Continue IV heparin and nitro  - Continue Carvedilol 3.125 mg BID  - Continue ASA and atorvastatin  - Plan for left heart cath today. Further recs pending results.  - Will need ACE/ARB started prior to discharge - If LVEF <40% will need aldosterone antagonist as well - Echocardiogram after cath   Hypertension - BP goal 130/80  - Continue Carvedilol and will start ACE/ARB s/p cath   HLD - LDL 121 - Continue atorvastatin   DM - A1c 7.0 - Currently managed with diet and lifestyle changes  - Could consider SGLT-2 inhibitor or GLP-1 for cardioprotective effect  Will discuss the case further with Dr. Tamala Julian.   For questions or updates, please contact [place] Please consult [email or web address] for contact info under Cardiology/STEMI.   Signed, Ina Homes, MD  02/07/2019, 9:20 AM

## 2019-02-07 NOTE — Progress Notes (Signed)
ANTICOAGULATION CONSULT NOTE - Follow-up Consult  Pharmacy Consult for heparin Indication: chest pain/ACS  No Known Allergies  Patient Measurements: Height: 5\' 10"  (177.8 cm) Weight: 164 lb (74.4 kg) IBW/kg (Calculated) : 73 Heparin Dosing Weight: 77kg  Vital Signs: Temp: 98.4 F (36.9 C) (02/19 0025) Temp Source: Oral (02/19 0025) BP: 104/58 (02/19 0056) Pulse Rate: 54 (02/19 0056)  Labs: Recent Labs    02/06/19 1436 02/06/19 1653 02/06/19 2344 02/07/19 0039  HGB 15.4  --   --   --   HCT 47.7  --   --   --   PLT 189  --   --   --   HEPARINUNFRC  --   --   --  0.30  CREATININE 1.12  --   --   --   TROPONINI  --  3.32* 1.97*  --     Estimated Creatinine Clearance: 62.5 mL/min (by C-G formula based on SCr of 1.12 mg/dL).  Assessment: 30 YOM presenting with CP, elevated troponin, pharmacy consulted to start heparin gtt.  No anticoagulation PTA, CBC wnl.    Heparin level 0.3 (low end of therapeutic) on gtt at 900 units/hr. Plan for cath today.  Goal of Therapy:  Heparin level 0.3-0.7 units/ml Monitor platelets by anticoagulation protocol: Yes   Plan:  Increase heparin to 1000 units/hr Will f/u post cath  Sherlon Handing, PharmD, BCPS Clinical pharmacist  **Pharmacist phone directory can now be found on [email or web address] ([email or web address]).  Listed under [place]. 02/07/2019 1:57 AM

## 2019-02-07 NOTE — Progress Notes (Signed)
TR BAND REMOVAL  LOCATION:  right radial  DEFLATED PER PROTOCOL:  Yes.    TIME BAND OFF / DRESSING APPLIED:   1530   SITE UPON ARRIVAL:   Level 0  SITE AFTER BAND REMOVAL:  Level 0  CIRCULATION SENSATION AND MOVEMENT:  Within Normal Limits  Yes.    COMMENTS:    

## 2019-02-07 NOTE — Care Management (Signed)
#  7.   S/W LARRY  @ [facility] # [phone] OPT- MEMBER   BRILINTA  90 MG BID  COVER- YES  CO-PAY- $ 193.04 TIER- 4 DRUG PRIOR APPROVAL-  NO  DEDUCTIBLE : NOT MET  PREFERRED PHARMACY : [facility]

## 2019-02-08 ENCOUNTER — Telehealth: Payer: Self-pay | Admitting: Interventional Cardiology

## 2019-02-08 DIAGNOSIS — I5041 Acute combined systolic (congestive) and diastolic (congestive) heart failure: Secondary | ICD-10-CM

## 2019-02-08 DIAGNOSIS — E785 Hyperlipidemia, unspecified: Secondary | ICD-10-CM

## 2019-02-08 DIAGNOSIS — I5021 Acute systolic (congestive) heart failure: Secondary | ICD-10-CM

## 2019-02-08 DIAGNOSIS — I10 Essential (primary) hypertension: Secondary | ICD-10-CM

## 2019-02-08 LAB — GLUCOSE, CAPILLARY: Glucose-Capillary: 128 mg/dL — ABNORMAL HIGH (ref 70–99)

## 2019-02-08 LAB — BASIC METABOLIC PANEL
Anion gap: 11 (ref 5–15)
BUN: 21 mg/dL (ref 8–23)
CO2: 22 mmol/L (ref 22–32)
Calcium: 8.6 mg/dL — ABNORMAL LOW (ref 8.9–10.3)
Chloride: 106 mmol/L (ref 98–111)
Creatinine, Ser: 1.2 mg/dL (ref 0.61–1.24)
GFR calc non Af Amer: >60 mL/min (ref >60)
Glucose, Bld: 174 mg/dL — ABNORMAL HIGH (ref 70–99)
Potassium: 3.9 mmol/L (ref 3.5–5.1)
SODIUM: 139 mmol/L (ref 135–145)

## 2019-02-08 LAB — CBC
HCT: 40 % (ref 39.0–52.0)
Hemoglobin: 12.9 g/dL — ABNORMAL LOW (ref 13.0–17.0)
MCH: 27.6 pg (ref 26.0–34.0)
MCHC: 32.3 g/dL (ref 30.0–36.0)
MCV: 85.5 fL (ref 80.0–100.0)
NRBC: 0 % (ref 0.0–0.2)
Platelets: 156 10*3/uL (ref 150–400)
RBC: 4.68 MIL/uL (ref 4.22–5.81)
RDW: 13.2 % (ref 11.5–15.5)
WBC: 10.2 10*3/uL (ref 4.0–10.5)

## 2019-02-08 MED ORDER — CARVEDILOL 3.125 MG PO TABS: 3.1250 mg | ORAL_TABLET | Freq: Two times a day (BID) | ORAL | 11 refills | Status: DC

## 2019-02-08 MED ORDER — LOSARTAN POTASSIUM 50 MG PO TABS
25.0000 mg | ORAL_TABLET | Freq: Every day | ORAL | Status: DC
  Administered 2019-02-08: 25 mg via ORAL
  Filled 2019-02-08: qty 1

## 2019-02-08 MED ORDER — INFLUENZA VAC SPLIT HIGH-DOSE 0.5 ML IM SUSY
0.5000 mL | PREFILLED_SYRINGE | INTRAMUSCULAR | Status: AC | PRN
  Administered 2019-02-08: 12:00:00 0.5 mL via INTRAMUSCULAR
  Filled 2019-02-08: qty 0.5

## 2019-02-08 MED ORDER — LOSARTAN POTASSIUM 25 MG PO TABS: 25.0000 mg | ORAL_TABLET | Freq: Every day | ORAL | 11 refills | Status: DC

## 2019-02-08 MED ORDER — NITROGLYCERIN 0.4 MG SL SUBL: 0.4000 mg | SUBLINGUAL_TABLET | SUBLINGUAL | 1 refills | Status: AC | PRN

## 2019-02-08 MED ORDER — TICAGRELOR 90 MG PO TABS: 90.0000 mg | ORAL_TABLET | Freq: Two times a day (BID) | ORAL | 11 refills | Status: DC

## 2019-02-08 MED ORDER — ASPIRIN 81 MG PO TBEC: 81.0000 mg | DELAYED_RELEASE_TABLET | Freq: Every day | ORAL | 3 refills | Status: DC

## 2019-02-08 MED ORDER — ATORVASTATIN CALCIUM 80 MG PO TABS: 80.0000 mg | ORAL_TABLET | Freq: Every day | ORAL | 11 refills | Status: DC

## 2019-02-08 MED FILL — ASPIRIN LOW DOSE 81 MG TBEC: 81 | 90 days supply | Qty: 90 | Fill #0 | Status: TO

## 2019-02-08 MED FILL — CARVEDILOL 3.125 MG TABLET: 3.125 | 30 days supply | Qty: 60 | Fill #0 | Status: TO

## 2019-02-08 MED FILL — NITROGLYCERIN 0.4 MG TAB SL: 0.4 | 8 days supply | Qty: 25 | Fill #0 | Status: TO

## 2019-02-08 MED FILL — ATORVASTATIN CALCIUM 80 MG: 80 | 30 days supply | Qty: 30 | Fill #0 | Status: TO

## 2019-02-08 MED FILL — LOSARTAN POTASSIUM 25 MG TA: 25 | 30 days supply | Qty: 30 | Fill #0 | Status: TO

## 2019-02-08 MED FILL — BRILINTA 90 MG TABLET: 90 | 30 days supply | Qty: 60 | Fill #0 | Status: TO

## 2019-02-08 NOTE — Progress Notes (Signed)
   The radial cath site is unremarkable.  Minimal troponin elevation since admission.  Kidney function is stable.  Blood pressures a little elevated and that along with mild decrease in LV function leads to beta-blocker therapy and losartan for blood pressure control and LV recovery.  Therapy can be uptitrated as an outpatient to control blood pressure.  Heart rate relatively low on low-dose beta-blocker so did not uptitrate this morning.  LDL was 121 on admission.  High intensity statin therapy is in order to achieve LDL less than 70.  Needs clinical follow-up in 7 to 10 days.

## 2019-02-08 NOTE — Consult Note (Signed)
[facility] CM Primary Care Navigator  02/08/2019  [place] Mar 11, 1946 [id number]   Met withpatient at the bedsidetoidentify possible discharge needs.  Patientreportshaving "intermittent chest pains" that resulted to this admission. (NSTEMI- non ST elevated myocardial infarction status post cardiac cath and stent intervention, HTN, hyperlipidemia, acute systolic HF with EF at 83- 50%)  PatientendorsesDr.Charles Melinda Crutch with [place] [facility] [place] care provider.   [facility] [address] [facility] without difficulty.  Patientreportsthat he manages his own medicationsat home- ("barely taking anything") straight out of the containers but plans to use "pill box" after discharge.  Patient states that hehasbeen drivingprior to admission but wife Vaughan Basta) will beproviding transportation tohisdoctors' appointments if needed post discharge.  Patientliveswithwifewho will serveas hisprimary caregiverat home when needed.  Anticipated plan for discharge ishomewith possible cardiac rehab per patient.  Patientvoiced understandingto callprimary care provider's office when he gets homefor a post discharge follow-upvisit within 1- 2 weeksor sooner if needs arise.Patient letter (with PCP's contact number) was provided asareminder.Patient mentioned that he has already an appointment to see primary care provider next week and will keep it.  Explained topatientaboutTHN CM services available for health management andresourcesat homebuthe denies any pressing needs orconcernsat thistime. He is agreeable to implement lifestyle modifications to manage his health. Patient however, plans to discuss with primary care provider on his upcoming appointment regarding further needs and assistance in managing his health issues (mainly HTN, DM,  HF).  Patientverbalizedunderstandingto seekreferral from primary care provider to [facility] care management ifdeemed necessary and appropriatefor anyservicesin thenear future.  [facility] care management information was provided for futureneeds thathemay have.  Patienthowever,verbally agreedand optedforEMMIcalls tofollow-up withhisrecovery at home.   Referral made for [facility] General calls after discharge.    For additional questions please contact:  Edwena Felty A. Camila Maita, BSN, RN-BC [facility] PRIMARY CARE Navigator Cell: [phone]

## 2019-02-08 NOTE — Telephone Encounter (Signed)
Reino Bellis requested [facility] appt with Cecilie Kicks on 3/2 for patient.

## 2019-02-08 NOTE — Care Management Note (Signed)
Case Management Note  Patient Details  Name: Billy Lawson MRN: [id number] Date of Birth: Dec 22, 1945  Subjective/Objective:  From home , s/p stent intervention, will be on brilinta, NCM spoke with patient regarding his copay amt, informed him that at his cardiologist follow up if too expensive to let his MD know and they will switch him to something else.  Also informed him that the cardiologist has samples in his office.                   Action/Plan: DC home when ready.  Expected Discharge Date:  02/08/19               Expected Discharge Plan:  Home/Self Care  In-House Referral:     Discharge planning Services  CM Consult, Medication Assistance  Post Acute Care Choice:    Choice offered to:     DME Arranged:    DME Agency:     HH Arranged:    HH Agency:     Status of Service:  Completed, signed off  If discussed at [employer] of Stay Meetings, dates discussed:    Additional Comments:  Zenon Mayo, RN 02/08/2019, 9:34 AM

## 2019-02-08 NOTE — Discharge Summary (Addendum)
The patient has been seen in conjunction with Reino Bellis, NP. All aspects of care have been considered and discussed. The patient has been personally interviewed, examined, and all clinical data has been reviewed.   The radial cath site is unremarkable.  Minimal troponin elevation since admission.  Kidney function is stable.  Blood pressures a little elevated and that along with mild decrease in LV function leads to beta-blocker therapy and losartan for blood pressure control and LV recovery.  Therapy can be uptitrated as an outpatient to control blood pressure.  Heart rate relatively low on low-dose beta-blocker so did not uptitrate this morning.  LDL was 121 on admission.  High intensity statin therapy is in order to achieve LDL less than 70.  Needs clinical follow-up in 7 to 10 days   Discharge Summary    Patient ID: Billy Lawson,  MRN: [id number], DOB/AGE: [birth year] 73 y.o.  Admit date: 02/06/2019 Discharge date: 02/08/2019  Primary Care Provider: Lawerance Cruel Primary Cardiologist: Dr. Tamala Julian  Discharge Diagnoses    Active Problems:   NSTEMI (non-ST elevated myocardial infarction) [facility])   Essential hypertension   Hyperlipidemia   Acute systolic heart failure ([place])   Allergies No Known Allergies  Diagnostic Studies/Procedures    Cath: 02/07/2019   There is mild left ventricular systolic dysfunction.  LV end diastolic pressure is normal.  The left ventricular ejection fraction is 45-50% by visual estimate.  Prox LAD to Mid LAD lesion is 99% stenosed.  Post intervention, there is a 0% residual stenosis.  A drug-eluting stent was successfully placed using a STENT RESOLUTE ONYX 2.75X30.   1.  Severe one-vessel coronary artery disease with 99% stenosis in the proximal/mid LAD with TIMI II flow.  No other obstructive disease. 2.  Mildly reduced LV systolic function with an EF of 45 to 50% with anterior apical hypokinesis.  Normal left  ventricular end-diastolic pressure. 3.  Successful OCT guided PCI and drug-eluting stent placement to the LAD.  Recommendations: Dual antiplatelet therapy for at least 1 year. Aggressive treatment of risk factors. _____________   History of Present Illness     73 y.o.male with hyperlipidemia and diabetes mellitus who presented to the emergency department with substernal chest pain. Patient stated that approximately 10 days prior he experienced severe sharp/pressure like substernal chest pain that radiated into his neck and into his left arm. This lasted approximately 20 minutes and then abated without any intervention. Over the next several days he experienced intermittent episodes that did not seem as severe but continued to last 15 to 20 minutes in duration. He stated that after each episode he felt extremely tired and short of breath. His last episode was the night prior around 11 PM and again lasted approximately 20 minutes.  At baseline he is typically active and able to do all his ADLs without shortness of breath. He did state that he had some shortness of breath when he was exerting himself hard. This was been going on for the last 1 to 2 years and he has subsequently decreased the pace at which he completes tasks to accommodate the shortness of breath. He was a previous smoker and quit 2 to 3 years ago. Prior to this he was smoking at least two packs per day for over 30 years. He is a prior heavy alcohol user with his last drink being in 2001. He denied use of recreational drugs. He had no family history of heart disease. He had never  had a stress test but did say that he had calcifications around his heart on previous chest x-ray.  He denied fevers, rhinorrhea, headache, myalgias, arthralgias, orthopnea, PND, increased abdominal girth, decreased appetite, nausea/vomiting, decreased urine output, lower extremity edema. He had noted mild cough and chills. Given symptoms he was admitted with  plans for cath.   Hospital Course     Underwent cardiac cath noted above with successful PCI/DES x1 to the p/mLAD. Plan for DAPT with ASA/Brilinta for at least one year. EF noted at 45-50% with anterior apical hypokinesis. Normal LVEDP. No complications noted post cath with stable vital signs. Worked well with cardiac rehab without recurrent chest pain. Added low dose BB, ARB, and high dose statin prior to discharge.   General: Well developed, well nourished, male appearing in no acute distress. Head: Normocephalic, atraumatic.  Neck: Supple without bruits, JVD. Lungs:  Resp regular and unlabored, CTA. Heart: RRR, S1, S2, no S3, S4, or murmur; no rub. Abdomen: Soft, non-tender, non-distended with normoactive bowel sounds. No hepatomegaly. No rebound/guarding. No obvious abdominal masses. Extremities: No clubbing, cyanosis, edema. Distal pedal pulses are 2+ bilaterally. Right radial cath site stable without bruising or hematoma Neuro: Alert and oriented X 3. Moves all extremities spontaneously. Psych: Normal affect.  Katrine Coho Ormer was seen by Dr. Tamala Julian and determined stable for discharge home. Follow up in the office has been arranged. Medications are listed below.   _____________  Discharge Vitals Blood pressure (!) 156/65, pulse 60, temperature (!) 97.5 F (36.4 C), temperature source Oral, resp. rate 20, height 5\' 10"  (1.778 m), weight 75 kg, SpO2 98 %.  Filed Weights   02/06/19 2018 02/07/19 0136 02/08/19 0457  Weight: 74.3 kg 74.4 kg 75 kg    Labs & Radiologic Studies    CBC Recent Labs    02/07/19 0438 02/08/19 0140  WBC 4.4 10.2  HGB 13.4 12.9*  HCT 40.7 40.0  MCV 84.4 85.5  PLT 172 194   Basic Metabolic Panel Recent Labs    02/06/19 1436 02/07/19 0438 02/08/19 0140  NA 139  --  139  K 4.4  --  3.9  CL 103  --  106  CO2 27  --  22  GLUCOSE 143*  --  174*  BUN 12  --  21  CREATININE 1.12 1.17 1.20  CALCIUM 10.2  --  8.6*   Liver Function Tests No  results for input(s): AST, ALT, ALKPHOS, BILITOT, PROT, ALBUMIN in the last 72 hours. No results for input(s): LIPASE, AMYLASE in the last 72 hours. Cardiac Enzymes Recent Labs    02/06/19 1653 02/06/19 2344 02/07/19 0438  TROPONINI 3.32* 1.97* 1.14*   BNP Invalid input(s): POCBNP D-Dimer No results for input(s): DDIMER in the last 72 hours. Hemoglobin A1C Recent Labs    02/06/19 1853  HGBA1C 7.0*   Fasting Lipid Panel Recent Labs    02/07/19 0438  CHOL 174  HDL 45  LDLCALC 121*  TRIG 38  CHOLHDL 3.9   Thyroid Function Tests Recent Labs    02/06/19 1656  TSH 2.205   _____________  Dg Chest 2 View  Result Date: 02/06/2019 CLINICAL DATA:  Mid chest pain radiating around to LEFT upper chest and arm sometimes into shoulders and up into RIGHT neck, fatigue, lightheadedness, shortness of breath, history COPD, diabetes mellitus, former smoker EXAM: CHEST - 2 VIEW COMPARISON:  07/14/2011 FINDINGS: Normal heart size, mediastinal contours, and pulmonary vascularity. Atherosclerotic calcification aorta. Lungs clear. No pulmonary infiltrate, pleural  effusion or pneumothorax. Bones unremarkable. IMPRESSION: No acute abnormalities. Electronically Signed   By: Lavonia Dana M.D.   On: 02/06/2019 15:23   Disposition   Pt is being discharged home today in good condition.  Follow-up Plans & Appointments    Follow-up Information    Isaiah Serge, NP Follow up on 02/19/2019.   Specialties:  Cardiology, Radiology Why:  at [facility] for your follow up appt.  Contact information: [address] [place] [address] [phone]          Discharge Instructions    Amb Referral to Cardiac Rehabilitation   Complete by:  As directed    Diagnosis:   Coronary Stents NSTEMI     Call MD for:  redness, tenderness, or signs of infection (pain, swelling, redness, odor or green/yellow discharge around incision site)   Complete by:  As directed    Diet - low sodium heart healthy    Complete by:  As directed    Discharge instructions   Complete by:  As directed    Radial Site Care Refer to this sheet in the next few weeks. These instructions provide you with information on caring for yourself after your procedure. Your caregiver may also give you more specific instructions. Your treatment has been planned according to current medical practices, but problems sometimes occur. Call your caregiver if you have any problems or questions after your procedure. HOME CARE INSTRUCTIONS You may shower the day after the procedure.Remove the bandage (dressing) and gently wash the site with plain soap and water.Gently pat the site dry.  Do not apply powder or lotion to the site.  Do not submerge the affected site in water for 3 to 5 days.  Inspect the site at least twice daily.  Do not flex or bend the affected arm for 24 hours.  No lifting over 5 pounds (2.3 kg) for 5 days after your procedure.  Do not drive home if you are discharged the same day of the procedure. Have someone else drive you.  You may drive 24 hours after the procedure unless otherwise instructed by your caregiver.  What to expect: Any bruising will usually fade within 1 to 2 weeks.  Blood that collects in the tissue (hematoma) may be painful to the touch. It should usually decrease in size and tenderness within 1 to 2 weeks.  SEEK IMMEDIATE MEDICAL CARE IF: You have unusual pain at the radial site.  You have redness, warmth, swelling, or pain at the radial site.  You have drainage (other than a small amount of blood on the dressing).  You have chills.  You have a fever or persistent symptoms for more than 72 hours.  You have a fever and your symptoms suddenly get worse.  Your arm becomes pale, cool, tingly, or numb.  You have heavy bleeding from the site. Hold pressure on the site.    PLEASE DO NOT MISS ANY DOSES OF YOUR BRILINTA!!!!! Also keep a log of you blood pressures and bring back to your follow up  appt. Please call the office with any questions.   Patients taking blood thinners should generally stay away from medicines like ibuprofen, Advil, Motrin, naproxen, and Aleve due to risk of stomach bleeding. You may take Tylenol as directed or talk to your primary doctor about alternatives.   Increase activity slowly   Complete by:  As directed        Discharge Medications     Medication List  STOP taking these medications   ibuprofen 200 MG tablet Commonly known as:  ADVIL,MOTRIN   Red Yeast Rice 600 MG Caps     TAKE these medications   aspirin 81 MG EC tablet Take 1 tablet (81 mg total) by mouth daily.   atorvastatin 80 MG tablet Commonly known as:  LIPITOR Take 1 tablet (80 mg total) by mouth daily at 6 PM.   carvedilol 3.125 MG tablet Commonly known as:  COREG Take 1 tablet (3.125 mg total) by mouth 2 (two) times daily with a meal.   losartan 25 MG tablet Commonly known as:  COZAAR Take 1 tablet (25 mg total) by mouth daily.   nitroGLYCERIN 0.4 MG SL tablet Commonly known as:  NITROSTAT Place 1 tablet (0.4 mg total) under the tongue every 5 (five) minutes x 3 doses as needed for chest pain.   ticagrelor 90 MG Tabs tablet Commonly known as:  BRILINTA Take 1 tablet (90 mg total) by mouth 2 (two) times daily.        Acute coronary syndrome (MI, NSTEMI, STEMI, etc) this admission?: Yes.     AHA/ACC Clinical Performance & Quality Measures: 1. Aspirin prescribed? - Yes 2. ADP Receptor Inhibitor (Plavix/Clopidogrel, Brilinta/Ticagrelor or Effient/Prasugrel) prescribed (includes medically managed patients)? - Yes 3. Beta Blocker prescribed? - Yes 4. High Intensity Statin (Lipitor 40-80mg  or Crestor 20-40mg ) prescribed? - Yes 5. EF assessed during THIS hospitalization? - Yes 6. For EF <40%, was ACEI/ARB prescribed? - Not Applicable (EF >/= 27%) 7. For EF <40%, Aldosterone Antagonist (Spironolactone or Eplerenone) prescribed? - Not Applicable (EF >/=  25%) 8. Cardiac Rehab Phase II ordered (Included Medically managed Patients)? - Yes  Outstanding Labs/Studies   FLP/LFTs in 6 weeks. BMET at follow up appt.   Duration of Discharge Encounter   Greater than 30 minutes including physician time.  Signed, Reino Bellis NP-C 02/08/2019, 11:14 AM

## 2019-02-08 NOTE — Progress Notes (Signed)
CARDIAC REHAB PHASE I    Pt states he has been ambulating independently without CP or SOB. Pt educated on importance of ASA, Brilinta, and NTG. MI book and stent card at bedside. Pt given heart healthy and diabetic diets. Reviewed restrictions and exercise guidelines. Will refer to CRP II [place] Rufina Falco, RN BSN 02/08/2019 9:20 AM

## 2019-02-09 NOTE — Telephone Encounter (Signed)
Patient contacted regarding discharge from [facility] on 02/08/19.  Patient understands to follow up with provider Cecilie Kicks NP on 02/19/2019 at 0900 at [facility] location. Patient understands discharge instructions? Yes  Patient understands medications and regiment? yes Patient understands to bring all medications to this visit? Yes  Pt has no further questions or concerns at this time.  Pt more than gracious for all the assistance provided.

## 2019-02-09 NOTE — Telephone Encounter (Signed)
1st attempt:  Left the pt a message to call the office back and request to speak with a triage nurse, for TCM follow-up.

## 2019-02-13 ENCOUNTER — Telehealth (HOSPITAL_COMMUNITY): Payer: Self-pay

## 2019-02-13 DIAGNOSIS — K30 Functional dyspepsia: Secondary | ICD-10-CM | POA: Diagnosis not present

## 2019-02-13 DIAGNOSIS — E1169 Type 2 diabetes mellitus with other specified complication: Secondary | ICD-10-CM | POA: Diagnosis not present

## 2019-02-13 DIAGNOSIS — Z09 Encounter for follow-up examination after completed treatment for conditions other than malignant neoplasm: Secondary | ICD-10-CM | POA: Diagnosis not present

## 2019-02-13 DIAGNOSIS — I213 ST elevation (STEMI) myocardial infarction of unspecified site: Secondary | ICD-10-CM | POA: Diagnosis not present

## 2019-02-13 NOTE — Telephone Encounter (Signed)
Pt insurance is active and benefits verified through [employer] Co-pay $45.00, DED $0.00/0 met, out of pocket $4,200/0 met, co-insurance 0%. no pre-authorization required. Passport, 02/13/2019 @ 11:22pm, REF# [id number] ° °Will contact patient to see if he is interested in the Cardiac Rehab Program. If interested, patient will need to complete follow up appt. Once completed, patient will be contacted for scheduling upon review by the RN Navigator. °

## 2019-02-13 NOTE — Telephone Encounter (Signed)
Called patient to see if he is interested in the Cardiac Rehab Program. Patient expressed interest. Explained scheduling process and went over insurance, patient verbalized understanding. Will contact patient for scheduling once f/u has been completed. °Gloria W. Support Rep II °

## 2019-02-14 NOTE — Progress Notes (Signed)
Transitions of Care Follow Up Call Note  Billy Lawson is an 73 y.o. male who presented to [facility] on 02/06/2019.  The patient had the following prescriptions filled at [facility]: ASA (81MG ), LIPITOR, COREG, BRILINTA, LOSARTAN, NTG  Patient was called by pharmacist and HIPAA identifiers were verified. The following questions were asked about the prescriptions filled at [facility]:  Has the patient been experiencing any side effects to the medications prescribed? N/A Understanding of regimen: N/A Understanding of indications: N/A  Potential of compliance: N/A   []  Patient's prescriptions filled at the [facility] Transitions of Care Pharmacy were transferred to the following pharmacy:   [x]  Patient unable to be reached after calling three times and prescriptions filled at the [facility] Transitions of Care Pharmacy were transferred to preferred pharmacy found within their chart. ([facility] [address])   Mills Koller 02/14/2019, 5:14 PM Transitions of Care Pharmacy Hours: Monday - Friday 8:30am to 5:00 PM  Phone - [phone]

## 2019-02-18 NOTE — Progress Notes (Deleted)
Cardiology Office Note   Date:  02/18/2019   ID:  Cheney, Ewart 1946/09/13, MRN [id number]  PCP:  Lawerance Cruel, MD  Cardiologist:  Dr. Tamala Julian    No chief complaint on file.     History of Present Illness: Billy Lawson is a 73 y.o. male who presents for post hospitalization for NSTEMI -he had chest pain intermediately for 10 days.  With each episode was associated with fatigue and dyspnea. In ER EKG with SR with T wave inversion in ant. Lateral leads,  Troponin 2.5 on admit. He was admitted 02/06/19.  Placed on IV heparin and NTG.  He has a hx of hyperlipidemia and diabetes mellitus stopped tobacco 2-3 years ago.  He was hypertensive on admit >190/90  Cardiac cath with pLAD to mLAD 99% stenosis.  DES placed successfully EF 45-50%.  Pk troponin 3.32 LDL was 121. D/c'd with ASA/Brilinta/statin and BB. And losartan and NTG.  BP at discharge 156/65.  BMP and Echo   Past Medical History:  Diagnosis Date  . Allergic rhinitis   . COPD (chronic obstructive pulmonary disease) ([place])   . Coronary artery disease   . Diabetes mellitus without complication ([place])   . Hyperlipidemia   . Pneumonia    once in his 49s & was hospitalized without intubation    Past Surgical History:  Procedure Laterality Date  . [place]  . CORONARY STENT INTERVENTION  02/07/2019  . CORONARY STENT INTERVENTION N/A 02/07/2019   Procedure: CORONARY STENT INTERVENTION;  Surgeon: Wellington Hampshire, MD;  Location: [facility];  Service: Cardiovascular;  Laterality: N/A;  . INTRAVASCULAR ULTRASOUND/IVUS N/A 02/07/2019   Procedure: Intravascular Ultrasound/IVUS;  Surgeon: Wellington Hampshire, MD;  Location: [facility];  Service: Cardiovascular;  Laterality: N/A;  . LEFT HEART CATH AND CORONARY ANGIOGRAPHY N/A 02/07/2019   Procedure: LEFT HEART CATH AND CORONARY ANGIOGRAPHY;  Surgeon: Wellington Hampshire, MD;  Location: [facility];  Service: Cardiovascular;   Laterality: N/A;     Current Outpatient Medications  Medication Sig Dispense Refill  . aspirin EC 81 MG EC tablet Take 1 tablet (81 mg total) by mouth daily. 90 tablet 3  . atorvastatin (LIPITOR) 80 MG tablet Take 1 tablet (80 mg total) by mouth daily at 6 PM. 30 tablet 11  . carvedilol (COREG) 3.125 MG tablet Take 1 tablet (3.125 mg total) by mouth 2 (two) times daily with a meal. 60 tablet 11  . losartan (COZAAR) 25 MG tablet Take 1 tablet (25 mg total) by mouth daily. 30 tablet 11  . nitroGLYCERIN (NITROSTAT) 0.4 MG SL tablet Place 1 tablet (0.4 mg total) under the tongue every 5 (five) minutes x 3 doses as needed for chest pain. 28 tablet 1  . ticagrelor (BRILINTA) 90 MG TABS tablet Take 1 tablet (90 mg total) by mouth 2 (two) times daily. 60 tablet 11   No current facility-administered medications for this visit.     Allergies:   Patient has no known allergies.    Social History:  The patient  reports that he quit smoking about 3 years ago. His smoking use included cigarettes. He started smoking about 55 years ago. He has a 78.30 pack-year smoking history. He has never used smokeless tobacco. He reports that he does not drink alcohol or use drugs.   Family History:  The patient's ***family history includes Aortic aneurysm in his paternal grandfather; Cancer in his brother; Diabetes  in his mother and paternal grandmother; Hypertension in his mother; Lymphoma in his father.    ROS:  General:no colds or fevers, no weight changes Skin:no rashes or ulcers HEENT:no blurred vision, no congestion CV:see HPI PUL:see HPI GI:no diarrhea constipation or melena, no indigestion GU:no hematuria, no dysuria MS:no joint pain, no claudication Neuro:no syncope, no lightheadedness Endo:no diabetes, no thyroid disease Wt Readings from Last 3 Encounters:  02/08/19 165 lb 5.5 oz (75 kg)  02/25/16 173 lb 9.6 oz (78.7 kg)  10/16/14 168 lb (76.2 kg)     PHYSICAL EXAM: VS:  There were no vitals  taken for this visit. , BMI There is no height or weight on file to calculate BMI. General:Pleasant affect, NAD Skin:Warm and dry, brisk capillary refill HEENT:normocephalic, sclera clear, mucus membranes moist Neck:supple, no JVD, no bruits  Heart:S1S2 RRR without murmur, gallup, rub or click Lungs:clear without rales, rhonchi, or wheezes GHW:EXHB, non tender, + BS, do not palpate liver spleen or masses Ext:no lower ext edema, 2+ pedal pulses, 2+ radial pulses Neuro:alert and oriented, MAE, follows commands, + facial symmetry    EKG:  EKG is ordered today. The ekg ordered today demonstrates ***   Recent Labs: 02/06/2019: TSH 2.205 02/08/2019: BUN 21; Creatinine, Ser 1.20; Hemoglobin 12.9; Platelets 156; Potassium 3.9; Sodium 139    Lipid Panel    Component Value Date/Time   CHOL 174 02/07/2019 0438   TRIG 38 02/07/2019 0438   HDL 45 02/07/2019 0438   CHOLHDL 3.9 02/07/2019 0438   VLDL 8 02/07/2019 0438   LDLCALC 121 (H) 02/07/2019 0438       Other studies Reviewed: Additional studies/ records that were reviewed today include: . Cardiac cath 02/08/19 Cath: 02/07/2019   There is mild left ventricular systolic dysfunction.  LV end diastolic pressure is normal.  The left ventricular ejection fraction is 45-50% by visual estimate.  Prox LAD to Mid LAD lesion is 99% stenosed.  Post intervention, there is a 0% residual stenosis.  A drug-eluting stent was successfully placed using a STENT RESOLUTE ONYX 2.75X30.  1. Severe one-vessel coronary artery disease with 99% stenosis in the proximal/mid LAD with TIMI II flow. No other obstructive disease. 2. Mildly reduced LV systolic function with an EF of 45 to 50% with anterior apical hypokinesis. Normal left ventricular end-diastolic pressure. 3. Successful OCT guided PCI and drug-eluting stent placement to the LAD.  Recommendations: Dual antiplatelet therapy for at least 1 year. Aggressive treatment of risk  factors. _____________   ASSESSMENT AND PLAN:  1.  ***   Current medicines are reviewed with the patient today.  The patient Has no concerns regarding medicines.  The following changes have been made:  See above Labs/ tests ordered today include:see above  Disposition:   FU:  see above  Signed, Cecilie Kicks, NP  02/18/2019 8:45 PM    [facility] [place], [place], [place] Phone: [phone]; Fax: [phone]

## 2019-02-19 ENCOUNTER — Encounter: Payer: Self-pay | Admitting: Cardiology

## 2019-02-19 ENCOUNTER — Ambulatory Visit: Payer: Medicare HMO | Admitting: Cardiology

## 2019-02-19 VITALS — BP 116/70 | HR 70 | Ht 70.0 in | Wt 167.0 lb

## 2019-02-19 DIAGNOSIS — N189 Chronic kidney disease, unspecified: Secondary | ICD-10-CM | POA: Diagnosis not present

## 2019-02-19 DIAGNOSIS — I10 Essential (primary) hypertension: Secondary | ICD-10-CM | POA: Diagnosis not present

## 2019-02-19 DIAGNOSIS — I219 Acute myocardial infarction, unspecified: Secondary | ICD-10-CM | POA: Diagnosis not present

## 2019-02-19 DIAGNOSIS — I519 Heart disease, unspecified: Secondary | ICD-10-CM

## 2019-02-19 DIAGNOSIS — I2510 Atherosclerotic heart disease of native coronary artery without angina pectoris: Secondary | ICD-10-CM

## 2019-02-19 DIAGNOSIS — E782 Mixed hyperlipidemia: Secondary | ICD-10-CM

## 2019-02-19 LAB — BASIC METABOLIC PANEL
BUN/Creatinine Ratio: 17 (ref 10–24)
BUN: 20 mg/dL (ref 8–27)
CO2: 21 mmol/L (ref 20–29)
Calcium: 9.2 mg/dL (ref 8.6–10.2)
Chloride: 102 mmol/L (ref 96–106)
Creatinine, Ser: 1.15 mg/dL (ref 0.76–1.27)
GFR, EST AFRICAN AMERICAN: 74 mL/min/{1.73_m2} (ref >59)
GFR, EST NON AFRICAN AMERICAN: 64 mL/min/{1.73_m2} (ref >59)
Glucose: 182 mg/dL — ABNORMAL HIGH (ref 65–99)
Potassium: 4.2 mmol/L (ref 3.5–5.2)
Sodium: 138 mmol/L (ref 134–144)

## 2019-02-19 NOTE — Progress Notes (Signed)
Cardiology Office Note   Date:  02/18/2019   ID:  Desmen, Schoffstall 03-25-1946, MRN [id number]  PCP:  Lawerance Cruel, MD           Cardiologist:  Dr. Fransisca Kaufmann hospitalization for NSTEMI.    History of Present Illness: Billy Lawson is a 73 y.o. male who presents for post hospitalization for NSTEMI -he had chest pain intermediately for 10 days.  With each episode was associated with fatigue and dyspnea. In ER EKG with SR with T wave inversion in ant. Lateral leads,  Troponin 2.5 on admit. He was admitted 02/06/19.  Placed on IV heparin and NTG.  He has a hx of hyperlipidemia and diabetes mellitus stopped tobacco 2-3 years ago.  He was hypertensive on admit >190/90  Cardiac cath with pLAD to mLAD 99% stenosis.  DES placed successfully EF 45-50%.  Pk troponin 3.32 LDL was 121. D/c'd with ASA/Brilinta/statin and BB. And losartan and NTG.  BP at discharge 156/65.  Today Pt is doing well, 2 episodes of mild chest discomfort and this weekend he thought he had fever.  Currently stable.  No chest pain or SOB.  BP is stable we discussed activity level and meds, and he does plan to go to cardiac rehab.   He will need BMP and Echo.  He discussed his stent and reviewed cath drawing.         Past Medical History:  Diagnosis Date  . Allergic rhinitis   . COPD (chronic obstructive pulmonary disease) ([facility])   . Coronary artery disease   . Diabetes mellitus without complication ([place])   . Hyperlipidemia   . Pneumonia    once in his 74s & was hospitalized without intubation         Past Surgical History:  Procedure Laterality Date  . [place]  . CORONARY STENT INTERVENTION  02/07/2019  . CORONARY STENT INTERVENTION N/A 02/07/2019   Procedure: CORONARY STENT INTERVENTION;  Surgeon: Wellington Hampshire, MD;  Location: [facility];  Service: Cardiovascular;  Laterality: N/A;  . INTRAVASCULAR ULTRASOUND/IVUS N/A 02/07/2019   Procedure:  Intravascular Ultrasound/IVUS;  Surgeon: Wellington Hampshire, MD;  Location: [facility];  Service: Cardiovascular;  Laterality: N/A;  . LEFT HEART CATH AND CORONARY ANGIOGRAPHY N/A 02/07/2019   Procedure: LEFT HEART CATH AND CORONARY ANGIOGRAPHY;  Surgeon: Wellington Hampshire, MD;  Location: [facility];  Service: Cardiovascular;  Laterality: N/A;           Current Outpatient Medications  Medication Sig Dispense Refill  . aspirin EC 81 MG EC tablet Take 1 tablet (81 mg total) by mouth daily. 90 tablet 3  . atorvastatin (LIPITOR) 80 MG tablet Take 1 tablet (80 mg total) by mouth daily at 6 PM. 30 tablet 11  . carvedilol (COREG) 3.125 MG tablet Take 1 tablet (3.125 mg total) by mouth 2 (two) times daily with a meal. 60 tablet 11  . losartan (COZAAR) 25 MG tablet Take 1 tablet (25 mg total) by mouth daily. 30 tablet 11  . nitroGLYCERIN (NITROSTAT) 0.4 MG SL tablet Place 1 tablet (0.4 mg total) under the tongue every 5 (five) minutes x 3 doses as needed for chest pain. 28 tablet 1  . ticagrelor (BRILINTA) 90 MG TABS tablet Take 1 tablet (90 mg total) by mouth 2 (two) times daily. 60 tablet 11   No current facility-administered medications for this visit.  Allergies:   Patient has no known allergies.    Social History:  The patient  reports that he quit smoking about 3 years ago. His smoking use included cigarettes. He started smoking about 55 years ago. He has a 78.30 pack-year smoking history. He has never used smokeless tobacco. He reports that he does not drink alcohol or use drugs.   Family History:  The patient's family history includes Aortic aneurysm in his paternal grandfather; Cancer in his brother; Diabetes in his mother and paternal grandmother; Hypertension in his mother; Lymphoma in his father.    ROS:  General:no colds or fevers, no weight changes Skin:no rashes or ulcers HEENT:no blurred vision, no congestion CV:see HPI PUL:see HPI GI:no diarrhea  constipation or melena, no indigestion GU:no hematuria, no dysuria MS:no joint pain, no claudication Neuro:no syncope, no lightheadedness Endo:no diabetes, no thyroid disease    Wt Readings from Last 3 Encounters:  02/08/19 165 lb 5.5 oz (75 kg)  02/25/16 173 lb 9.6 oz (78.7 kg)  10/16/14 168 lb (76.2 kg)     PHYSICAL EXAM: VS:  There were no vitals taken for this visit. , BMI There is no height or weight on file to calculate BMI. General:Pleasant affect, NAD Skin:Warm and dry, brisk capillary refill HEENT:normocephalic, sclera clear, mucus membranes moist Neck:supple, no JVD, no bruits  Heart:S1S2 RRR without murmur, gallup, rub or click Lungs:clear without rales, rhonchi, or wheezes PZW:CHEN, non tender, + BS, do not palpate liver spleen or masses Ext:no lower ext edema, 2+ pedal pulses, 2+ radial pulses Neuro:alert and oriented, MAE, follows commands, + facial symmetry    EKG:  EKG is ordered today. The ekg ordered today demonstrates SR with deep T wave inversion in ant lat leads but improved from discharge.     Recent Labs: 02/06/2019: TSH 2.205 02/08/2019: BUN 21; Creatinine, Ser 1.20; Hemoglobin 12.9; Platelets 156; Potassium 3.9; Sodium 139    Lipid Panel         Component Value Date/Time   CHOL 174 02/07/2019 0438   TRIG 38 02/07/2019 0438   HDL 45 02/07/2019 0438   CHOLHDL 3.9 02/07/2019 0438   VLDL 8 02/07/2019 0438   LDLCALC 121 (H) 02/07/2019 0438       Other studies Reviewed: Additional studies/ records that were reviewed today include: . Cardiac cath 02/08/19 Cath: 02/07/2019   There is mild left ventricular systolic dysfunction.  LV end diastolic pressure is normal.  The left ventricular ejection fraction is 45-50% by visual estimate.  Prox LAD to Mid LAD lesion is 99% stenosed.  Post intervention, there is a 0% residual stenosis.  A drug-eluting stent was successfully placed using a STENT RESOLUTE ONYX 2.75X30.  1.  Severe one-vessel coronary artery disease with 99% stenosis in the proximal/mid LAD with TIMI II flow. No other obstructive disease. 2. Mildly reduced LV systolic function with an EF of 45 to 50% with anterior apical hypokinesis. Normal left ventricular end-diastolic pressure. 3. Successful OCT guided PCI and drug-eluting stent placement to the LAD.  Recommendations: Dual antiplatelet therapy for at least 1 year. Aggressive treatment of risk factors. _____________   ASSESSMENT AND PLAN:  1.  CAD with NSTEMI --cath with pLAD to mLAD with 99% stenosis and resolute DES placed.  Meds:continue Brilinta and ASA.  Follow up with Dr. Tamala Julian in 2 months. Ok to attend cardiac rehab.   2.  LV dysfunction on cath will recheck Echo to eval. EF.Meds:continue ARB if follow up echo EF remains low then may need  to change to [facility]   3.  HLD continue statin will need follow up lipid and hepatic on next visit.    4.  HTN controlled today no change in meds  5.  CKD-3 check BMP    Current medicines are reviewed with the patient today.  The patient Has no concerns regarding medicines.  The following changes have been made:  See above Labs/ tests ordered today include:see above  Disposition:   FU:  see above  Signed, Cecilie Kicks, NP  02/18/2019 8:45 PM    [facility] [address], [place], [place] Phone: [phone]; Fax: [phone]

## 2019-02-19 NOTE — Patient Instructions (Signed)
Medication Instructions:  Your physician recommends that you continue on your current medications as directed. Please refer to the Current Medication list given to you today.  If you need a refill on your cardiac medications before your next appointment, please call your pharmacy.   Lab work: TODAY: BMET  If you have labs (blood work) drawn today and your tests are completely normal, you will receive your results only by: Marland Kitchen MyChart Message (if you have MyChart) OR . A paper copy in the mail If you have any lab test that is abnormal or we need to change your treatment, we will call you to review the results.  Testing/Procedures: Your physician has requested that you have an echocardiogram in 3-4 weeks. Echocardiography is a painless test that uses sound waves to create images of your heart. It provides your doctor with information about the size and shape of your heart and how well your heart's chambers and valves are working. This procedure takes approximately one hour. There are no restrictions for this procedure.    Follow-Up: At [facility], you and your health needs are our priority.  As part of our continuing mission to provide you with exceptional heart care, we have created designated Provider Care Teams.  These Care Teams include your primary Cardiologist (physician) and Advanced Practice Providers (APPs -  Physician Assistants and Nurse Practitioners) who all work together to provide you with the care you need, when you need it. You will need a follow up appointment in 2 months.  Please call our office 2 months in advance to schedule this appointment.  You may see Dr. Tamala Julian or one of the following Advanced Practice Providers on your designated Care Team:   Truitt Merle, NP Cecilie Kicks, NP . Kathyrn Drown, NP  Any Other Special Instructions Will Be Listed Below (If Applicable).

## 2019-02-28 NOTE — Telephone Encounter (Signed)
Called patient to see if he was interested in participating in the Cardiac Rehab Program. Patient stated yes. Patient will come in for orientation on 03/20/2019 @ 8:30am and will attend the 11:15am exercise class.  Mailed homework package. Tedra Senegal. Support Rep II

## 2019-03-13 ENCOUNTER — Telehealth: Payer: Self-pay

## 2019-03-13 ENCOUNTER — Telehealth (HOSPITAL_COMMUNITY): Payer: Self-pay

## 2019-03-13 DIAGNOSIS — R69 Illness, unspecified: Secondary | ICD-10-CM | POA: Diagnosis not present

## 2019-03-13 NOTE — Telephone Encounter (Signed)
Attempted to contact pt in regards to CR, left voicemail advising pt we had to cancel their orientation appt. And our department will be closed for the next 4 weeks due to the COVID-19. Once we resume scheduling we will contact pt to reschedule.

## 2019-03-13 NOTE — Telephone Encounter (Signed)
New message   Spoke with patient per staff messages from Dr. Radford Pax echocardiogram has been rescheduled to 04/19/19.

## 2019-03-14 ENCOUNTER — Ambulatory Visit (HOSPITAL_COMMUNITY): Payer: Medicare HMO

## 2019-03-20 ENCOUNTER — Ambulatory Visit (HOSPITAL_COMMUNITY): Payer: Medicare HMO

## 2019-03-20 ENCOUNTER — Ambulatory Visit: Payer: Medicare HMO

## 2019-03-26 ENCOUNTER — Ambulatory Visit (HOSPITAL_COMMUNITY): Payer: Medicare HMO

## 2019-03-27 ENCOUNTER — Telehealth: Payer: Self-pay | Admitting: Interventional Cardiology

## 2019-03-27 NOTE — Telephone Encounter (Signed)
Stop Brilinta and start Clopidogrel 300 mg with last Brilinta. Thereafter, Clopidogrel should be 75 mg daily.

## 2019-03-27 NOTE — Telephone Encounter (Signed)
Pt calling concerned about SOB.  States since seeing Mickel Baas in early March he has noticed that he gets winded when working out in the yard or going for walks.  States he has been sitting around a lot but does notice this when he is up and moving.  Also feels fatigued, like he has no energy and has a slight cough.  Has had a couple of episodes of chest pressure, no pain.  They resolve quickly.  Pt states he has been very anxious with having an MI, the virus and allergy season and feels like that is what caused the episodes of pressure. Pt states he always has allergy trouble this time of year.  Pt concerned the Brilinta may be causing the SOB.  Advised I would send message to Dr. Tamala Julian for review.

## 2019-03-27 NOTE — Telephone Encounter (Signed)
New Message    Pt c/o medication issue:  1. Name of Medication: Brilinta   2. How are you currently taking this medication (dosage and times per day)? 90mg  1 tablet 2x daily   3. Are you having a reaction (difficulty breathing--STAT)? Patient has some shortness of breath   4. What is your medication issue? Pts wife is calling and says he is having horrible side effects from this medication. She said some SOB

## 2019-03-28 ENCOUNTER — Ambulatory Visit (HOSPITAL_COMMUNITY): Payer: Medicare HMO

## 2019-03-28 MED ORDER — CLOPIDOGREL BISULFATE 75 MG PO TABS: 75.0000 mg | ORAL_TABLET | Freq: Every day | ORAL | 3 refills | Status: DC

## 2019-03-28 NOTE — Telephone Encounter (Signed)
° ° °  Please return call to patient °

## 2019-03-28 NOTE — Telephone Encounter (Signed)
Left message to call back  

## 2019-03-28 NOTE — Telephone Encounter (Signed)
Spoke with pt and went over recommendations per Dr. Tamala Julian. Pt verbalized understanding and was in agreement with plan.  Advised to call if any issues once starting Plavix.

## 2019-03-30 ENCOUNTER — Ambulatory Visit (HOSPITAL_COMMUNITY): Payer: Medicare HMO

## 2019-04-02 ENCOUNTER — Ambulatory Visit (HOSPITAL_COMMUNITY): Payer: Medicare HMO

## 2019-04-04 ENCOUNTER — Ambulatory Visit (HOSPITAL_COMMUNITY): Payer: Medicare HMO

## 2019-04-05 ENCOUNTER — Telehealth: Payer: Self-pay | Admitting: Interventional Cardiology

## 2019-04-05 NOTE — Telephone Encounter (Signed)
New Message    Pt is wondering if he will still need to have the Echo at the end of the month  Pt is fine with his MAY  1st Appt being changed to a virtual visit but wants to know if he still needs the echo before the vistual appt

## 2019-04-06 ENCOUNTER — Ambulatory Visit (HOSPITAL_COMMUNITY): Payer: Medicare HMO

## 2019-04-09 ENCOUNTER — Ambulatory Visit (HOSPITAL_COMMUNITY): Payer: Medicare HMO

## 2019-04-09 NOTE — Telephone Encounter (Signed)
Echo ordered by NP to reassess LV function due to it being 45-50% on cath in February.  Currently scheduled for 4/30 echo and appt with you on 5/1 which we will change to virtual.  Do you want to proceed with echo and appt with you or plan on pushing them out?

## 2019-04-09 NOTE — Telephone Encounter (Signed)
Spoke with pt and made him aware of recommendations.  Pt states he feels ok and is willing to put off but wife may not be happy about it.  He would like to talk to her and then call me and let me know what they decide.

## 2019-04-09 NOTE — Telephone Encounter (Signed)
Okay to move to June for echo and OV later this summer, if doing okay.

## 2019-04-11 ENCOUNTER — Ambulatory Visit (HOSPITAL_COMMUNITY): Payer: Medicare HMO

## 2019-04-11 ENCOUNTER — Telehealth (HOSPITAL_COMMUNITY): Payer: Self-pay | Admitting: *Deleted

## 2019-04-11 NOTE — Telephone Encounter (Signed)
Called and spoke to both pt and wife for advisement of Cardiac Rehab continued closure due to adherence of national recommendation for Covid-19 in group setting. Pt and wife verbalized understanding. Pt is walking trails with no complaints of cp or sob. Pt has hand weights and would like information number of reps, wt limits and how often.  Pt provided general guidelines and will also mail a handout which shows warm up and cool down stretches along with hand weight exercises. Pt appreciative of the call. Cherre Huger, BSN Cardiac and Training and development officer

## 2019-04-12 NOTE — Telephone Encounter (Signed)
Patient set up for MyChart? Yes sent consent  Is patient using Smartphone/computer/tablet? smartphone  Did audio/video work?  Does patient need telephone visit? no  Best phone number to use? [phone]     Virtual Visit Pre-Appointment Phone Call  "(Name), I am calling you today to discuss your upcoming appointment. We are currently trying to limit exposure to the virus that causes COVID-19 by seeing patients at home rather than in the office."  1. "What is the BEST phone number to call the day of the visit?" - include this in appointment notes  2. Do you have or have access to (through a family member/friend) a smartphone with video capability that we can use for your visit?" a. If yes - list this number in appt notes as cell (if different from BEST phone #) and list the appointment type as a VIDEO visit in appointment notes b. If no - list the appointment type as a PHONE visit in appointment notes  3. Confirm consent - "In the setting of the current Covid19 crisis, you are scheduled for a (phone or video) visit with your provider on (date) at (time).  Just as we do with many in-office visits, in order for you to participate in this visit, we must obtain consent.  If you'd like, I can send this to your mychart (if signed up) or email for you to review.  Otherwise, I can obtain your verbal consent now.  All virtual visits are billed to your insurance company just like a normal visit would be.  By agreeing to a virtual visit, we'd like you to understand that the technology does not allow for your provider to perform an examination, and thus may limit your provider's ability to fully assess your condition. If your provider identifies any concerns that need to be evaluated in person, we will make arrangements to do so.  Finally, though the technology is pretty good, we cannot assure that it will always work on either your or our end, and in the setting of a video visit, we may have to  convert it to a phone-only visit.  In either situation, we cannot ensure that we have a secure connection.  Are you willing to proceed?" STAFF: Did the patient verbally acknowledge consent to telehealth visit? Document YES/NO here: yes   4. Advise patient to be prepared - "Two hours prior to your appointment, go ahead and check your blood pressure, pulse, oxygen saturation, and your weight (if you have the equipment to check those) and write them all down. When your visit starts, your provider will ask you for this information. If you have an Apple Watch or Kardia device, please plan to have heart rate information ready on the day of your appointment. Please have a pen and paper handy nearby the day of the visit as well."  5. Give patient instructions for MyChart download to smartphone OR Doximity/Doxy.me as below if video visit (depending on what platform provider is using)  6. Inform patient they will receive a phone call 15 minutes prior to their appointment time (may be from unknown caller ID) so they should be prepared to answer    TELEPHONE CALL NOTE  Perrion Diesel has been deemed a candidate for a follow-up tele-health visit to limit community exposure during the Covid-19 pandemic. I spoke with the patient via phone to ensure availability of phone/video source, confirm preferred email & phone number, and discuss instructions and expectations.  I reminded Katrine Coho Ormer  to be prepared with any vital sign and/or heart rhythm information that could potentially be obtained via home monitoring, at the time of his visit. I reminded KYRO JOSWICK Ormer to expect a phone call prior to his visit.  Howie Ill 04/12/2019 4:06 PM   INSTRUCTIONS FOR DOWNLOADING THE MYCHART APP TO SMARTPHONE  - The patient must first make sure to have activated MyChart and know their login information - If Apple, go to [employer] and type in MyChart in the search bar and download the app. If Android, ask  patient to go to [employer] and type in [place] in the search bar and download the app. The app is free but as with any other app downloads, their phone may require them to verify saved payment information or Apple/Android password.  - The patient will need to then log into the app with their MyChart username and password, and select [facility] as their healthcare provider to link the account. When it is time for your visit, go to the MyChart app, find appointments, and click Begin Video Visit. Be sure to Select Allow for your device to access the Microphone and Camera for your visit. You will then be connected, and your provider will be with you shortly.  **If they have any issues connecting, or need assistance please contact MyChart service desk [phone])**  **If using a computer, in order to ensure the best quality for their visit they will need to use either of the following Internet Browsers: [employer], or Google Chrome**  IF USING DOXIMITY or DOXY.ME - The patient will receive a link just prior to their visit by text.     FULL LENGTH CONSENT FOR TELE-HEALTH VISIT   I hereby voluntarily request, consent and authorize [facility] and its employed or contracted physicians, physician assistants, nurse practitioners or other licensed health care professionals (the Practitioner), to provide me with telemedicine health care services (the Services") as deemed necessary by the treating Practitioner. I acknowledge and consent to receive the Services by the Practitioner via telemedicine. I understand that the telemedicine visit will involve communicating with the Practitioner through live audiovisual communication technology and the disclosure of certain medical information by electronic transmission. I acknowledge that I have been given the opportunity to request an in-person assessment or other available alternative prior to the telemedicine visit and am voluntarily  participating in the telemedicine visit.  I understand that I have the right to withhold or withdraw my consent to the use of telemedicine in the course of my care at any time, without affecting my right to future care or treatment, and that the Practitioner or I may terminate the telemedicine visit at any time. I understand that I have the right to inspect all information obtained and/or recorded in the course of the telemedicine visit and may receive copies of available information for a reasonable fee.  I understand that some of the potential risks of receiving the Services via telemedicine include:   Delay or interruption in medical evaluation due to technological equipment failure or disruption;  Information transmitted may not be sufficient (e.g. poor resolution of images) to allow for appropriate medical decision making by the Practitioner; and/or   In rare instances, security protocols could fail, causing a breach of personal health information.  Furthermore, I acknowledge that it is my responsibility to provide information about my medical history, conditions and care that is complete and accurate to the best of my ability. I acknowledge  that Practitioner's advice, recommendations, and/or decision may be based on factors not within their control, such as incomplete or inaccurate data provided by me or distortions of diagnostic images or specimens that may result from electronic transmissions. I understand that the practice of medicine is not an exact science and that Practitioner makes no warranties or guarantees regarding treatment outcomes. I acknowledge that I will receive a copy of this consent concurrently upon execution via email to the email address I last provided but may also request a printed copy by calling the office of [facility].    I understand that my insurance will be billed for this visit.   I have read or had this consent read to me.  I understand the contents of this  consent, which adequately explains the benefits and risks of the Services being provided via telemedicine.   I have been provided ample opportunity to ask questions regarding this consent and the Services and have had my questions answered to my satisfaction.  I give my informed consent for the services to be provided through the use of telemedicine in my medical care  By participating in this telemedicine visit I agree to the above.

## 2019-04-13 ENCOUNTER — Ambulatory Visit (HOSPITAL_COMMUNITY): Payer: Medicare HMO

## 2019-04-16 ENCOUNTER — Ambulatory Visit (HOSPITAL_COMMUNITY): Payer: Medicare HMO

## 2019-04-18 ENCOUNTER — Ambulatory Visit (HOSPITAL_COMMUNITY): Payer: Medicare HMO

## 2019-04-19 ENCOUNTER — Ambulatory Visit (HOSPITAL_COMMUNITY): Payer: Medicare HMO | Attending: Cardiovascular Disease

## 2019-04-19 DIAGNOSIS — I2510 Atherosclerotic heart disease of native coronary artery without angina pectoris: Secondary | ICD-10-CM | POA: Insufficient documentation

## 2019-04-19 NOTE — Progress Notes (Signed)
Virtual Visit via Video Note   This visit type was conducted due to national recommendations for restrictions regarding the COVID-19 Pandemic (e.g. social distancing) in an effort to limit this patient's exposure and mitigate transmission in our community.  Due to his co-morbid illnesses, this patient is at least at moderate risk for complications without adequate follow up.  This format is felt to be most appropriate for this patient at this time.  All issues noted in this document were discussed and addressed.  A limited physical exam was performed with this format.  Please refer to the patient's chart for his consent to telehealth for [facility].   Evaluation Performed:  Follow-up visit  Date:  04/20/2019   ID:  Billy Lawson, DOB 02-24-1946, MRN [id number]  Patient Location: Home Provider Location: Office  PCP:  Lawerance Cruel, MD  Cardiologist:  No primary care provider on file.  Electrophysiologist:  None   Chief Complaint:   CAD with high risk factor cohort  History of Present Illness:    Billy Lawson is a 73 y.o. male with NSTEMI 02/07/19 DES to LAD , hyperlipidemia, and DM 2.  Dyspnea has resolved. He denies chest pain, orthopnea, and PND. Recognizes DM II as a risk factor but reticent about medical therapy. No exercising. No specific medication side effects other than some itching.  Overall, doing well. Stopped smoking.  The patient does not have symptoms concerning for COVID-19 infection (fever, chills, cough, or new shortness of breath).    Past Medical History:  Diagnosis Date  . Allergic rhinitis   . COPD (chronic obstructive pulmonary disease) ([place])   . Coronary artery disease   . Diabetes mellitus without complication ([place])   . Hyperlipidemia   . Pneumonia    once in his 83s & was hospitalized without intubation   Past Surgical History:  Procedure Laterality Date  . [place]  . CORONARY STENT INTERVENTION  02/07/2019   . CORONARY STENT INTERVENTION N/A 02/07/2019   Procedure: CORONARY STENT INTERVENTION;  Surgeon: Wellington Hampshire, MD;  Location: [facility];  Service: Cardiovascular;  Laterality: N/A;  . INTRAVASCULAR ULTRASOUND/IVUS N/A 02/07/2019   Procedure: Intravascular Ultrasound/IVUS;  Surgeon: Wellington Hampshire, MD;  Location: [facility];  Service: Cardiovascular;  Laterality: N/A;  . LEFT HEART CATH AND CORONARY ANGIOGRAPHY N/A 02/07/2019   Procedure: LEFT HEART CATH AND CORONARY ANGIOGRAPHY;  Surgeon: Wellington Hampshire, MD;  Location: [place] [facility];  Service: Cardiovascular;  Laterality: N/A;     Current Meds  Medication Sig  . aspirin EC 81 MG EC tablet Take 1 tablet (81 mg total) by mouth daily.  Marland Kitchen atorvastatin (LIPITOR) 80 MG tablet Take 1 tablet (80 mg total) by mouth daily at 6 PM.  . carvedilol (COREG) 3.125 MG tablet Take 1 tablet (3.125 mg total) by mouth 2 (two) times daily with a meal.  . clopidogrel (PLAVIX) 75 MG tablet Take 1 tablet (75 mg total) by mouth daily.  Marland Kitchen losartan (COZAAR) 25 MG tablet Take 1 tablet (25 mg total) by mouth daily.  . nitroGLYCERIN (NITROSTAT) 0.4 MG SL tablet Place 1 tablet (0.4 mg total) under the tongue every 5 (five) minutes x 3 doses as needed for chest pain.     Allergies:   Patient has no known allergies.   Social History   Tobacco Use  . Smoking status: Former Smoker    Packs/day: 1.45    Years:  54.00    Pack years: 78.30    Types: Cigarettes    Start date: 12/21/1963    Last attempt to quit: 01/21/2016    Years since quitting: 3.2  . Smokeless tobacco: Never Used  . Tobacco comment: USES [employer]OR[employer]   Substance Use Topics  . Alcohol use: No    Alcohol/week: 0.0 standard drinks    Comment: Recovering alcoholic   . Drug use: No    Comment: Remote marijuana     Family Hx: The patient's family history includes Aortic aneurysm in his paternal grandfather; Cancer in his brother; Diabetes in his mother and paternal  grandmother; Hypertension in his mother; Lymphoma in his father. There is no history of Lung disease.  ROS:   Please see the history of present illness.    Itching. All other systems reviewed and are negative.   Prior CV studies:   The following studies were reviewed today:  CATH / PCI 02/07/19: Diagnostic  Dominance: Right    Intervention      Labs/Other Tests and Data Reviewed:    EKG:  No ECG reviewed.  Recent Labs: 02/06/2019: TSH 2.205 02/08/2019: Hemoglobin 12.9; Platelets 156 02/19/2019: BUN 20; Creatinine, Ser 1.15; Potassium 4.2; Sodium 138   Recent Lipid Panel Lab Results  Component Value Date/Time   CHOL 174 02/07/2019 04:38 AM   TRIG 38 02/07/2019 04:38 AM   HDL 45 02/07/2019 04:38 AM   CHOLHDL 3.9 02/07/2019 04:38 AM   LDLCALC 121 (H) 02/07/2019 04:38 AM    Wt Readings from Last 3 Encounters:  04/20/19 163 lb (73.9 kg)  02/19/19 167 lb (75.8 kg)  02/08/19 165 lb 5.5 oz (75 kg)     Objective:    Vital Signs:  Pulse 80   Ht 5\' 10"  (1.778 m)   Wt 163 lb (73.9 kg)   BMI 23.39 kg/m    VITAL SIGNS:  reviewed   Normal respiratory pattern.  Alert and oriented x 2  No cyanosis  ASSESSMENT & PLAN:    1. Coronary artery disease involving native coronary artery of native heart without angina pectoris   2. Essential hypertension   3. Hyperlipidemia LDL goal <70   4. Controlled type 2 diabetes mellitus with other circulatory complication, without long-term current use of insulin ([place])   5. 2019 novel coronavirus disease (COVID-19)    PLAN:  1. Discussed secondary risk prevention in detail. See below. Strongly encouraged moderate aerobic activity. 2. See below. 3. On high intensity statin and tolerating. 4. Hgb A1c < 7 is target and probably needs to add SGLT-2 to enhance. 5. LVEF 45% during hospital stay. Needs reassessment with echo prior to next visit.  Overall education and awareness concerning primary/secondary risk prevention was discussed  in detail: LDL less than 70, hemoglobin A1c less than 7, blood pressure target less than 130/80 mmHg, >150 minutes of moderate aerobic activity per week, avoidance of smoking, weight control (via diet and exercise), and continued surveillance/management of/for obstructive sleep apnea.    COVID-19 Education: The signs and symptoms of COVID-19 were discussed with the patient and how to seek care for testing (follow up with PCP or arrange E-visit).  The importance of social distancing was discussed today.  Time:   Today, I have spent 20 minutes with the patient with telehealth technology discussing the above problems.     Medication Adjustments/Labs and Tests Ordered: Current medicines are reviewed at length with the patient today.  Concerns regarding medicines are outlined above.  Tests Ordered: No orders of the defined types were placed in this encounter.   Medication Changes: No orders of the defined types were placed in this encounter.   Disposition:  Follow up in 3 month(s)  Signed, Sinclair Grooms, MD  04/20/2019 12:06 PM    [place]

## 2019-04-20 ENCOUNTER — Other Ambulatory Visit: Payer: Self-pay

## 2019-04-20 ENCOUNTER — Telehealth (INDEPENDENT_AMBULATORY_CARE_PROVIDER_SITE_OTHER): Payer: Medicare HMO | Admitting: Interventional Cardiology

## 2019-04-20 ENCOUNTER — Ambulatory Visit (HOSPITAL_COMMUNITY): Payer: Medicare HMO

## 2019-04-20 ENCOUNTER — Encounter: Payer: Self-pay | Admitting: Interventional Cardiology

## 2019-04-20 ENCOUNTER — Encounter

## 2019-04-20 VITALS — HR 80 | Ht 70.0 in | Wt 163.0 lb

## 2019-04-20 DIAGNOSIS — E785 Hyperlipidemia, unspecified: Secondary | ICD-10-CM

## 2019-04-20 DIAGNOSIS — I2510 Atherosclerotic heart disease of native coronary artery without angina pectoris: Secondary | ICD-10-CM

## 2019-04-20 DIAGNOSIS — E119 Type 2 diabetes mellitus without complications: Secondary | ICD-10-CM | POA: Insufficient documentation

## 2019-04-20 DIAGNOSIS — U071 COVID-19: Secondary | ICD-10-CM

## 2019-04-20 DIAGNOSIS — I10 Essential (primary) hypertension: Secondary | ICD-10-CM

## 2019-04-20 DIAGNOSIS — E1159 Type 2 diabetes mellitus with other circulatory complications: Secondary | ICD-10-CM

## 2019-04-20 NOTE — Telephone Encounter (Signed)
° °  Patient has additional questions setting up video virtual appt

## 2019-04-20 NOTE — Patient Instructions (Signed)
Medication Instructions:  Your physician recommends that you continue on your current medications as directed. Please refer to the Current Medication list given to you today.  If you need a refill on your cardiac medications before your next appointment, please call your pharmacy.   Lab work: None If you have labs (blood work) drawn today and your tests are completely normal, you will receive your results only by: Marland Kitchen MyChart Message (if you have MyChart) OR . A paper copy in the mail If you have any lab test that is abnormal or we need to change your treatment, we will call you to review the results.  Testing/Procedures: None  Follow-Up: Your physician recommends that you schedule a follow-up appointment after your echocardiogram is complete.   Any Other Special Instructions Will Be Listed Below (If Applicable).

## 2019-04-20 NOTE — Telephone Encounter (Signed)
April, CMA called pt and he is now ready for visit.

## 2019-04-23 ENCOUNTER — Ambulatory Visit (HOSPITAL_COMMUNITY): Payer: Medicare HMO

## 2019-04-25 ENCOUNTER — Ambulatory Visit (HOSPITAL_COMMUNITY): Payer: Medicare HMO

## 2019-04-25 ENCOUNTER — Ambulatory Visit: Payer: Medicare HMO | Admitting: Interventional Cardiology

## 2019-04-27 ENCOUNTER — Ambulatory Visit (HOSPITAL_COMMUNITY): Payer: Medicare HMO

## 2019-04-30 ENCOUNTER — Ambulatory Visit (HOSPITAL_COMMUNITY): Payer: Medicare HMO

## 2019-05-02 ENCOUNTER — Ambulatory Visit (HOSPITAL_COMMUNITY): Payer: Medicare HMO

## 2019-05-04 ENCOUNTER — Ambulatory Visit (HOSPITAL_COMMUNITY): Payer: Medicare HMO

## 2019-05-07 ENCOUNTER — Ambulatory Visit (HOSPITAL_COMMUNITY): Payer: Medicare HMO

## 2019-05-08 ENCOUNTER — Other Ambulatory Visit: Payer: Self-pay | Admitting: Acute Care

## 2019-05-08 DIAGNOSIS — Z87891 Personal history of nicotine dependence: Secondary | ICD-10-CM

## 2019-05-08 DIAGNOSIS — Z122 Encounter for screening for malignant neoplasm of respiratory organs: Secondary | ICD-10-CM

## 2019-05-09 ENCOUNTER — Ambulatory Visit (HOSPITAL_COMMUNITY): Payer: Medicare HMO

## 2019-05-11 ENCOUNTER — Ambulatory Visit (HOSPITAL_COMMUNITY): Payer: Medicare HMO

## 2019-05-16 ENCOUNTER — Ambulatory Visit (HOSPITAL_COMMUNITY): Payer: Medicare HMO

## 2019-05-18 ENCOUNTER — Ambulatory Visit (HOSPITAL_COMMUNITY): Payer: Medicare HMO

## 2019-05-21 ENCOUNTER — Ambulatory Visit (HOSPITAL_COMMUNITY): Payer: Medicare HMO

## 2019-05-23 ENCOUNTER — Telehealth: Payer: Self-pay | Admitting: *Deleted

## 2019-05-23 ENCOUNTER — Ambulatory Visit (HOSPITAL_COMMUNITY): Payer: Medicare HMO

## 2019-05-23 NOTE — Telephone Encounter (Signed)

## 2019-05-25 ENCOUNTER — Other Ambulatory Visit: Payer: Self-pay

## 2019-05-25 ENCOUNTER — Ambulatory Visit (INDEPENDENT_AMBULATORY_CARE_PROVIDER_SITE_OTHER)
Admission: RE | Admit: 2019-05-25 | Discharge: 2019-05-25 | Disposition: A | Discharge status: Home / Self Care | Payer: Medicare HMO | Source: Ambulatory Visit | Attending: Acute Care | Admitting: Acute Care

## 2019-05-25 ENCOUNTER — Ambulatory Visit (HOSPITAL_COMMUNITY): Payer: Medicare HMO

## 2019-05-25 DIAGNOSIS — Z87891 Personal history of nicotine dependence: Secondary | ICD-10-CM

## 2019-05-25 DIAGNOSIS — Z122 Encounter for screening for malignant neoplasm of respiratory organs: Secondary | ICD-10-CM

## 2019-05-28 ENCOUNTER — Telehealth (HOSPITAL_COMMUNITY): Payer: Self-pay | Admitting: *Deleted

## 2019-05-28 ENCOUNTER — Other Ambulatory Visit: Payer: Self-pay | Admitting: Acute Care

## 2019-05-28 ENCOUNTER — Ambulatory Visit (HOSPITAL_COMMUNITY): Payer: Medicare HMO

## 2019-05-28 DIAGNOSIS — Z87891 Personal history of nicotine dependence: Secondary | ICD-10-CM

## 2019-05-28 DIAGNOSIS — Z122 Encounter for screening for malignant neoplasm of respiratory organs: Secondary | ICD-10-CM

## 2019-05-28 NOTE — Telephone Encounter (Signed)
Pt called to inquire about interest in virtual cardiac rehab as department remains closed d/t COVID 19.  Pt does not wish to enroll in virtual cardiac rehab at this time.    Pt has received information from the department and does not wish to receive any additional information at this time. Pt is currently walking and participating in yard work at this time.    Informed patient that we will call patient when department reopens.  Pt verbalized understanding.

## 2019-05-30 ENCOUNTER — Ambulatory Visit (HOSPITAL_COMMUNITY): Payer: Medicare HMO

## 2019-06-01 ENCOUNTER — Ambulatory Visit (HOSPITAL_COMMUNITY): Payer: Medicare HMO

## 2019-06-04 ENCOUNTER — Ambulatory Visit (HOSPITAL_COMMUNITY): Payer: Medicare HMO

## 2019-06-05 DIAGNOSIS — R69 Illness, unspecified: Secondary | ICD-10-CM | POA: Diagnosis not present

## 2019-06-06 ENCOUNTER — Ambulatory Visit (HOSPITAL_COMMUNITY): Payer: Medicare HMO

## 2019-06-07 ENCOUNTER — Telehealth (HOSPITAL_COMMUNITY): Payer: Self-pay

## 2019-06-07 ENCOUNTER — Telehealth: Payer: Self-pay | Admitting: *Deleted

## 2019-06-07 NOTE — Telephone Encounter (Signed)
Follow Up  Patient is return

## 2019-06-07 NOTE — Telephone Encounter (Signed)
Called pt re: appt 06/11/2019 to prescreen for covid 19.  Left a message for pt to call back.

## 2019-06-07 NOTE — Telephone Encounter (Signed)
Pt returned my call and he has answered no to the covid19 prescreen questions below:      COVID-19 Pre-Screening Questions:  . In the past 7 to 10 days have you had a cough,  shortness of breath, headache, congestion, fever (100 or greater) body aches, chills, sore throat, or sudden loss of taste or sense of smell? . Have you been around anyone with known Covid 19. . Have you been around anyone who is awaiting Covid 19 test results in the past 7 to 10 days? . Have you been around anyone who has been exposed to Covid 19, or has mentioned symptoms of Covid 19 within the past 7 to 10 days?  If you have any concerns/questions about symptoms patients report during screening (either on the phone or at threshold). Contact the provider seeing the patient or DOD for further guidance.  If neither are available contact a member of the leadership team.

## 2019-06-07 NOTE — Progress Notes (Signed)
Cardiology Office Note   Date:  06/11/2019   ID:  Billy, Lawson 1946/07/25, MRN [id number]  PCP:  Lawerance Cruel, MD  Cardiologist: Dr. Daneen Schick, MD  Chief Complaint  Patient presents with  . Follow-up  . Coronary Artery Disease   History of Present Illness: Billy Lawson is a 73 y.o. male who presents for follow-up of CAD, seen for Dr. Tamala Julian.  [name] has a prior history of non-STEMI and 02/07/2019 with DES to LAD placement, hyperlipidemia and DM 2.  He was last seen by Dr. Tamala Julian on 04/20/2019 in follow-up.  At that time, he reported that his dyspnea had resolved and he had no chest pain, orthopnea or PND. He recognized the DM2 as a cardiac risk factor but was reluctant about medical therapy. He was not exercising regularly. Overall he was doing well.  He had stopped smoking.  Today [name] states that his energy was initially improved after his stent placement however over the last 3 to 4 weeks has noticed a mild decrease in his energy level.  He denies shortness of breath, PND, LE swelling or orthopnea symptoms. He reports what he describes as orthostatic dizziness with change of position. BPs are running on the lower side in the low 601'U systolically.  He continues to work in the yard and exercise without anginal symptoms. He had a brief episode of a chest "twing" that lasted only a short time and was during increased stress. He states that the sensation dissipated on its own without the use of SL NTG. He was resting during the time. He states it is difficult for him to determine whether his symptoms are psychological or in fact "real" symptoms. He states that his MI happened at a difficult time given COVID-19 and the isolation and lack of structure has taken its toll on him. He reports being under more stress because of this.  We discussed reestablishing a structured regimen for him to follow to help determine his new normal versus abnormal. He was  reluctant to start cardiac rehab but seems interested now but also realizes that it is on hold given COVID 19. He has a friend who owns a ym and has been going there to walk on the treadmill and ride the stationary bike. He continues to not smoke however under stressful times, will take up to 10 nicotine lodges a day. We discussed reducing this.   Past Medical History:  Diagnosis Date  . Allergic rhinitis   . COPD (chronic obstructive pulmonary disease) ([place])   . Coronary artery disease   . Diabetes mellitus without complication ([place])   . Hyperlipidemia   . Pneumonia    once in his 55s & was hospitalized without intubation   Past Surgical History:  Procedure Laterality Date  . [place]  . CORONARY STENT INTERVENTION  02/07/2019  . CORONARY STENT INTERVENTION N/A 02/07/2019   Procedure: CORONARY STENT INTERVENTION;  Surgeon: Wellington Hampshire, MD;  Location: [facility];  Service: Cardiovascular;  Laterality: N/A;  . INTRAVASCULAR ULTRASOUND/IVUS N/A 02/07/2019   Procedure: Intravascular Ultrasound/IVUS;  Surgeon: Wellington Hampshire, MD;  Location: [facility];  Service: Cardiovascular;  Laterality: N/A;  . LEFT HEART CATH AND CORONARY ANGIOGRAPHY N/A 02/07/2019   Procedure: LEFT HEART CATH AND CORONARY ANGIOGRAPHY;  Surgeon: Wellington Hampshire, MD;  Location: [facility];  Service: Cardiovascular;  Laterality: N/A;  Current Outpatient Medications  Medication Sig Dispense Refill  . aspirin EC 81 MG EC tablet Take 1 tablet (81 mg total) by mouth daily. 90 tablet 3  . atorvastatin (LIPITOR) 80 MG tablet Take 1 tablet (80 mg total) by mouth daily at 6 PM. 30 tablet 11  . carvedilol (COREG) 3.125 MG tablet Take 1 tablet (3.125 mg total) by mouth 2 (two) times daily with a meal. 60 tablet 11  . clopidogrel (PLAVIX) 75 MG tablet Take 1 tablet (75 mg total) by mouth daily. 90 tablet 3  . losartan (COZAAR) 25 MG tablet Take 0.5 tablets (12.5 mg total) by  mouth daily. 45 tablet 3  . nitroGLYCERIN (NITROSTAT) 0.4 MG SL tablet Place 1 tablet (0.4 mg total) under the tongue every 5 (five) minutes x 3 doses as needed for chest pain. 28 tablet 1   No current facility-administered medications for this visit.     Allergies:   Patient has no known allergies.   Social History:  The patient  reports that he quit smoking about 3 years ago. His smoking use included cigarettes. He started smoking about 55 years ago. He has a 78.30 pack-year smoking history. He has never used smokeless tobacco. He reports that he does not drink alcohol or use drugs.   Family History:  The patient's family history includes Aortic aneurysm in his paternal grandfather; Cancer in his brother; Diabetes in his mother and paternal grandmother; Hypertension in his mother; Lymphoma in his father.    ROS:  Please see the history of present illness.   Otherwise, review of systems are positive for none.   All other systems are reviewed and negative.    PHYSICAL EXAM: VS:  BP 110/62   Pulse 70   Ht 5\' 10"  (1.778 m)   Wt 164 lb 12.8 oz (74.8 kg)   SpO2 97%   BMI 23.65 kg/m  , BMI Body mass index is 23.65 kg/m.   General: Well developed, well nourished, NAD Skin: Warm, dry, intact  Neck: Negative for carotid bruits. No JVD Lungs:Clear to ausculation bilaterally. No wheezes, rales, or rhonchi. Breathing is unlabored. Cardiovascular: RRR with S1 S2. No murmurs, rubs, gallops, or LV heave appreciated. Extremities: No edema. No clubbing or cyanosis. DP/PT pulses 2+ bilaterally Neuro: Alert and oriented. No focal deficits. No facial asymmetry. MAE spontaneously. Psych: Responds to questions appropriately with normal affect.    EKG:  EKG is not ordered today.  Recent Labs: 02/06/2019: TSH 2.205 02/08/2019: Hemoglobin 12.9; Platelets 156 02/19/2019: BUN 20; Creatinine, Ser 1.15; Potassium 4.2; Sodium 138   Lipid Panel    Component Value Date/Time   CHOL 174 02/07/2019 0438    TRIG 38 02/07/2019 0438   HDL 45 02/07/2019 0438   CHOLHDL 3.9 02/07/2019 0438   VLDL 8 02/07/2019 0438   LDLCALC 121 (H) 02/07/2019 0438     Wt Readings from Last 3 Encounters:  06/11/19 164 lb 12.8 oz (74.8 kg)  04/20/19 163 lb (73.9 kg)  02/19/19 167 lb (75.8 kg)    Other studies Reviewed: Additional studies/ records that were reviewed today include:   CATH / PCI 02/07/19: Diagnostic  Dominance: Right    Intervention       ASSESSMENT AND PLAN:  1.  CAD s/p STEMI with DES/PCI to LAD: -Stable, without complaints of angina. Long discussion with Dr. Tamala Julian about secondary risk for prevention including increased aerobic activity with aggressive management of LDL, hemoglobin A1c and BP. -Continue ASA, Plavix, high intensity atorvastatin, carvedilol,  losartan -Follow-up echocardiogram given last LVEF noted to be 45 to 50% with anterior apical hypokinesis scheduled for 06/08/2019 -Plans for dual antiplatelet therapy for at least 1 year -No c/o acute blood in stool or urine   2.  Essential hypertension with : -Stable, 110/62 -Decrease losartan to 12.5 mg daily -Continue carvedilol  3.  HLD: -LDL, 121 -Continue high intensity atorvastatin -Repeat lipid panel today  4.  DM2: -Last hemoglobin A1c, 7.0 -Needs addition of SGLT- 2 for cardiac risk factor reduction -Follows next month with PCP  5. Mild dilation of the aortic root: -Found per echocardiogram 06/08/2019 -Mild dilatation of the aortic root measuring 38 mm -We will need to follow with serial echocardiograms and maintain good BP control  Current medicines are reviewed at length with the patient today.  The patient does not have concerns regarding medicines.  The following changes have been made:  Decrease losartan to 12.5mg    Labs/ tests ordered today include:  Orders Placed This Encounter  Procedures  . Lipid panel  . Hepatic function panel    Disposition:   FU with Dr. Tamala Julian or APP in 3 months   Signed, Kathyrn Drown, NP  06/11/2019 2:07 PM    [address], [place], [place]  [address] Phone: [phone]; Fax: [phone]

## 2019-06-07 NOTE — Telephone Encounter (Signed)

## 2019-06-07 NOTE — Telephone Encounter (Signed)
Please disregard previous message.

## 2019-06-08 ENCOUNTER — Other Ambulatory Visit: Payer: Self-pay

## 2019-06-08 ENCOUNTER — Ambulatory Visit (HOSPITAL_COMMUNITY): Payer: Medicare HMO

## 2019-06-08 ENCOUNTER — Ambulatory Visit (HOSPITAL_COMMUNITY): Payer: Medicare HMO | Attending: Cardiology

## 2019-06-08 DIAGNOSIS — I519 Heart disease, unspecified: Secondary | ICD-10-CM | POA: Insufficient documentation

## 2019-06-08 DIAGNOSIS — I219 Acute myocardial infarction, unspecified: Secondary | ICD-10-CM | POA: Diagnosis not present

## 2019-06-11 ENCOUNTER — Encounter: Payer: Self-pay | Admitting: Cardiology

## 2019-06-11 ENCOUNTER — Ambulatory Visit: Payer: Medicare HMO | Admitting: Cardiology

## 2019-06-11 ENCOUNTER — Ambulatory Visit (HOSPITAL_COMMUNITY): Payer: Medicare HMO

## 2019-06-11 ENCOUNTER — Other Ambulatory Visit: Payer: Self-pay

## 2019-06-11 VITALS — BP 110/62 | HR 70 | Ht 70.0 in | Wt 164.8 lb

## 2019-06-11 DIAGNOSIS — I219 Acute myocardial infarction, unspecified: Secondary | ICD-10-CM

## 2019-06-11 DIAGNOSIS — E1159 Type 2 diabetes mellitus with other circulatory complications: Secondary | ICD-10-CM

## 2019-06-11 DIAGNOSIS — I2510 Atherosclerotic heart disease of native coronary artery without angina pectoris: Secondary | ICD-10-CM

## 2019-06-11 DIAGNOSIS — E785 Hyperlipidemia, unspecified: Secondary | ICD-10-CM | POA: Diagnosis not present

## 2019-06-11 DIAGNOSIS — I10 Essential (primary) hypertension: Secondary | ICD-10-CM | POA: Diagnosis not present

## 2019-06-11 MED ORDER — LOSARTAN POTASSIUM 25 MG PO TABS: 12.5000 mg | ORAL_TABLET | Freq: Every day | ORAL | 3 refills | Status: DC

## 2019-06-11 NOTE — Patient Instructions (Addendum)
Medication Instructions:  DECREASE: Losartan to 12.5 mg once a day  If you need a refill on your cardiac medications before your next appointment, please call your pharmacy.   Lab work: TODAY:LIPIDS & LFT'S   If you have labs (blood work) drawn today and your tests are completely normal, you will receive your results only by: Marland Kitchen MyChart Message (if you have MyChart) OR . A paper copy in the mail If you have any lab test that is abnormal or we need to change your treatment, we will call you to review the results.  Testing/Procedures: None  Follow-Up: At [facility], you and your health needs are our priority.  As part of our continuing mission to provide you with exceptional heart care, we have created designated Provider Care Teams.  These Care Teams include your primary Cardiologist (physician) and Advanced Practice Providers (APPs -  Physician Assistants and Nurse Practitioners) who all work together to provide you with the care you need, when you need it. You will need a follow up appointment in 3 months.  Please call our office 2 months in advance to schedule this appointment.  You may see Dr. Tamala Julian or one of the following Advanced Practice Providers on your designated Care Team:   Truitt Merle, NP Cecilie Kicks, NP . Kathyrn Drown, NP  Any Other Special Instructions Will Be Listed Below (If Applicable).

## 2019-06-12 LAB — LIPID PANEL
Chol/HDL Ratio: 3.3 ratio (ref 0.0–5.0)
Cholesterol, Total: 117 mg/dL (ref 100–199)
HDL: 36 mg/dL — ABNORMAL LOW (ref >39)
LDL Calculated: 51 mg/dL (ref 0–99)
Triglycerides: 150 mg/dL — ABNORMAL HIGH (ref 0–149)
VLDL Cholesterol Cal: 30 mg/dL (ref 5–40)

## 2019-06-12 LAB — HEPATIC FUNCTION PANEL
ALT: 27 IU/L (ref 0–44)
AST: 19 IU/L (ref 0–40)
Albumin: 4.4 g/dL (ref 3.7–4.7)
Alkaline Phosphatase: 45 IU/L (ref 39–117)
Bilirubin Total: 0.3 mg/dL (ref 0.0–1.2)
Bilirubin, Direct: 0.09 mg/dL (ref 0.00–0.40)
Total Protein: 6.2 g/dL (ref 6.0–8.5)

## 2019-06-13 ENCOUNTER — Ambulatory Visit (HOSPITAL_COMMUNITY): Payer: Medicare HMO

## 2019-06-14 ENCOUNTER — Telehealth: Payer: Self-pay

## 2019-06-14 DIAGNOSIS — E781 Pure hyperglyceridemia: Secondary | ICD-10-CM

## 2019-06-14 NOTE — Telephone Encounter (Signed)
Notes recorded by Frederik Schmidt, RN on 06/14/2019 at 12:53 PM EDT  The patient has been notified of the result and verbalized understanding. All questions (if any) were answered.  Keena Heesch, RN 06/14/2019 12:53 PM

## 2019-06-14 NOTE — Telephone Encounter (Signed)
-----   Message from Tommie Raymond, NP sent at 06/14/2019 12:33 PM EDT ----- Please let the patient know that his triglycerides were more elevated than last lab draw, from 38>150. Have him decrease his saturated fat intake and increased aerobic activity to at least 36min/day 4-5 days per week if he can. We will re-draw in 1-2 months and assess for improvement. If no improvement, may need additional therapy

## 2019-06-15 ENCOUNTER — Ambulatory Visit (HOSPITAL_COMMUNITY): Payer: Medicare HMO

## 2019-06-18 ENCOUNTER — Ambulatory Visit (HOSPITAL_COMMUNITY): Payer: Medicare HMO

## 2019-06-18 ENCOUNTER — Telehealth (HOSPITAL_COMMUNITY): Payer: Self-pay

## 2019-06-18 NOTE — Telephone Encounter (Signed)
Pt insurance is active and benefits verified through [facility]. Co-pay $45.00, DED $0.00/$0.00 met, out of pocket $4,200.00/$639.37 met, co-insurance 0%. No pre-authorization required. Passport, 06/18/2019 @ 3:16PM, KQA#[id number]

## 2019-06-20 ENCOUNTER — Ambulatory Visit (HOSPITAL_COMMUNITY): Payer: Medicare HMO

## 2019-06-25 ENCOUNTER — Ambulatory Visit (HOSPITAL_COMMUNITY): Payer: Medicare HMO

## 2019-06-27 ENCOUNTER — Ambulatory Visit (HOSPITAL_COMMUNITY): Payer: Medicare HMO

## 2019-06-29 ENCOUNTER — Telehealth (HOSPITAL_COMMUNITY): Payer: Self-pay | Admitting: *Deleted

## 2019-06-29 ENCOUNTER — Telehealth (HOSPITAL_COMMUNITY): Payer: Self-pay

## 2019-06-29 NOTE — Telephone Encounter (Signed)
-----   Message from Belva Crome, MD sent at 06/27/2019  9:48 PM EDT ----- Regarding: RE: Ok to participate in on-site cardiac rehab [place] for in person CR ----- Message ----- From: Sol Passer Sent: 06/27/2019   4:09 PM EDT To: Belva Crome, MD, Rowe Pavy, RN Subject: Ok to participate in on-site cardiac rehab     Dr. Tamala Julian,  We are now seeing patients on-site for cardiac rehab. A great deal of planning with advisement from our Medical Director - Dr. Radford Pax, CV Service line leadership, Infection Disease Control, Facilities, security,recommendations from American Association of Cardiac and Pulmonary Rehab (AACVPR) with the goal for optimal patient safety. Patients will have strict guidelines and criteria they must adhere to and follow.  Pt will wear a mask during exercise and practice social distancing. Pt will have to complete screening prior to entry into gym area.  Your pt expressed great interest  in facility cardiac rehab. Pt has a Covid risk score 8.  Do you feel this pt is appropriate to resume exercise in facility cardiac rehab?  Any additional restrictions you feel are appropriate for this pt?   Thank you and we appreciate your input  Sol Passer, MS, ACSM CEP Cardiac Rehab Staff

## 2019-07-05 ENCOUNTER — Telehealth (HOSPITAL_COMMUNITY): Payer: Self-pay

## 2019-08-07 ENCOUNTER — Telehealth (HOSPITAL_COMMUNITY): Payer: Self-pay

## 2019-08-07 NOTE — Telephone Encounter (Signed)
Cardiac Rehab Medication Review by a Pharmacist  Does the patient  feel that his/her medications are working for him/her?  yes  Has the patient been experiencing any side effects to the medications prescribed?  no  Does the patient measure his/her own blood pressure or blood glucose at home?  no   Does the patient have any problems obtaining medications due to transportation or finances?   no  Understanding of regimen: excellent Understanding of indications: excellent Potential of compliance: excellent  Pharmacist comments: N/a  Acey Lav, PharmD  PGY1 Acute Care Pharmacy Resident [phone] 08/07/2019 11:34 AM

## 2019-08-08 ENCOUNTER — Telehealth (HOSPITAL_COMMUNITY): Payer: Self-pay | Admitting: *Deleted

## 2019-08-09 ENCOUNTER — Other Ambulatory Visit: Payer: Self-pay

## 2019-08-09 ENCOUNTER — Encounter (HOSPITAL_COMMUNITY): Payer: Self-pay

## 2019-08-09 ENCOUNTER — Encounter (HOSPITAL_COMMUNITY)
Admission: RE | Admit: 2019-08-09 | Discharge: 2019-08-09 | Disposition: A | Discharge status: Home / Self Care | Payer: Medicare HMO | Source: Ambulatory Visit | Attending: Interventional Cardiology | Admitting: Interventional Cardiology

## 2019-08-09 VITALS — BP 118/60 | HR 72 | Ht 71.0 in | Wt 164.9 lb

## 2019-08-09 DIAGNOSIS — I214 Non-ST elevation (NSTEMI) myocardial infarction: Secondary | ICD-10-CM | POA: Insufficient documentation

## 2019-08-09 DIAGNOSIS — Z955 Presence of coronary angioplasty implant and graft: Secondary | ICD-10-CM | POA: Insufficient documentation

## 2019-08-09 NOTE — Progress Notes (Signed)
Cardiac Individual Treatment Plan  Patient Details  Name: Javonnie Illescas MRN: [id number] Date of Birth: 1946-07-14 Referring Provider:     CARDIAC REHAB PHASE II ORIENTATION from 08/09/2019 in [facility]  Referring Provider  Dr. Tamala Julian      Initial Encounter Date:    CARDIAC REHAB PHASE II ORIENTATION from 08/09/2019 in [facility] [place] [facility]  Date  08/09/19      Visit Diagnosis: NSTEMI (non-ST elevated myocardial infarction) ([place]) 02/06/19  Status post coronary artery stent placement 02/07/19  Patient's Home Medications on Admission:  Current Outpatient Medications:  .  aspirin EC 81 MG EC tablet, Take 1 tablet (81 mg total) by mouth daily., Disp: 90 tablet, Rfl: 3 .  atorvastatin (LIPITOR) 80 MG tablet, Take 1 tablet (80 mg total) by mouth daily at 6 PM., Disp: 30 tablet, Rfl: 11 .  carvedilol (COREG) 3.125 MG tablet, Take 1 tablet (3.125 mg total) by mouth 2 (two) times daily with a meal., Disp: 60 tablet, Rfl: 11 .  clopidogrel (PLAVIX) 75 MG tablet, Take 1 tablet (75 mg total) by mouth daily., Disp: 90 tablet, Rfl: 3 .  losartan (COZAAR) 25 MG tablet, Take 0.5 tablets (12.5 mg total) by mouth daily., Disp: 45 tablet, Rfl: 3 .  nitroGLYCERIN (NITROSTAT) 0.4 MG SL tablet, Place 1 tablet (0.4 mg total) under the tongue every 5 (five) minutes x 3 doses as needed for chest pain., Disp: 28 tablet, Rfl: 1  Past Medical History: Past Medical History:  Diagnosis Date  . Allergic rhinitis   . COPD (chronic obstructive pulmonary disease) ([place])   . Coronary artery disease   . Diabetes mellitus without complication ([place])   . Hyperlipidemia   . Pneumonia    once in his 70s & was hospitalized without intubation    Tobacco Use: Social History   Tobacco Use  Smoking Status Former Smoker  . Packs/day: 1.45  . Years: 54.00  . Pack years: 78.30  . Types: Cigarettes  . Start date: 12/21/1963  . Quit date: 01/21/2016  . Years since  quitting: 3.5  Smokeless Tobacco Never Used  Tobacco Comment   USES [employer]UM     Labs: Recent Review Flowsheet Data    Labs for ITP Cardiac and Pulmonary Rehab Latest Ref Rng & Units 02/06/2019 02/07/2019 06/11/2019   Cholestrol 100 - 199 mg/dL - 174 117   LDLCALC 0 - 99 mg/dL - 121(H) 51   HDL >39 mg/dL - 45 36(L)   Trlycerides 0 - 149 mg/dL - 38 150(H)   Hemoglobin A1c 4.8 - 5.6 % 7.0(H) - -      Capillary Blood Glucose: Lab Results  Component Value Date   GLUCAP 128 (H) 02/08/2019   GLUCAP 165 (H) 02/07/2019   GLUCAP 138 (H) 02/07/2019   GLUCAP 96 02/07/2019   GLUCAP 149 (H) 02/07/2019     Exercise Target Goals: Exercise Program Goal: Individual exercise prescription set using results from initial 6 min walk test and THRR while considering  patient's activity barriers and safety.   Exercise Prescription Goal: Initial exercise prescription builds to 30-45 minutes a day of aerobic activity, 2-3 days per week.  Home exercise guidelines will be given to patient during program as part of exercise prescription that the participant will acknowledge.  Activity Barriers & Risk Stratification: Activity Barriers & Cardiac Risk Stratification - 08/09/19 1208      Activity Barriers & Cardiac Risk Stratification   Activity Barriers  None    Cardiac Risk Stratification  High       6 Minute Walk: 6 Minute Walk    Row Name 08/09/19 1207         6 Minute Walk   Phase  Initial     Distance  1610 feet     Walk Time  6 minutes     # of Rest Breaks  0     MPH  3     METS  3.5     RPE  12     Perceived Dyspnea   0     VO2 Peak  12.43     Symptoms  No     Resting HR  72 bpm     Resting BP  118/60     Resting Oxygen Saturation   99 %     Exercise Oxygen Saturation  during 6 min walk  99 %     Max Ex. HR  88 bpm     Max Ex. BP  128/70     2 Minute Post BP  116/64        Oxygen Initial Assessment:   Oxygen Re-Evaluation:   Oxygen Discharge (Final Oxygen  Re-Evaluation):   Initial Exercise Prescription: Initial Exercise Prescription - 08/09/19 1200      Date of Initial Exercise RX and Referring Provider   Date  08/09/19    Referring Provider  Dr. Tamala Julian    Expected Discharge Date  09/14/19      Treadmill   MPH  3    Grade  1    Minutes  15      NuStep   Level  3    SPM  85    Minutes  15    METs  3      Prescription Details   Frequency (times per week)  3    Duration  Progress to 30 minutes of continuous aerobic without signs/symptoms of physical distress      Intensity   THRR 40-80% of Max Heartrate  59-118    Ratings of Perceived Exertion  11-13      Progression   Progression  Continue to progress workloads to maintain intensity without signs/symptoms of physical distress.      Resistance Training   Training Prescription  Yes    Weight  4 lbs.     Reps  10-15       Perform Capillary Blood Glucose checks as needed.  Exercise Prescription Changes:   Exercise Comments:   Exercise Goals and Review: Exercise Goals    Row Name 08/09/19 1212             Exercise Goals   Increase Physical Activity  Yes       Intervention  Provide advice, education, support and counseling about physical activity/exercise needs.;Develop an individualized exercise prescription for aerobic and resistive training based on initial evaluation findings, risk stratification, comorbidities and participant's personal goals.       Expected Outcomes  Short Term: Attend rehab on a regular basis to increase amount of physical activity.;Long Term: Add in home exercise to make exercise part of routine and to increase amount of physical activity.;Long Term: Exercising regularly at least 3-5 days a week.       Increase Strength and Stamina  Yes       Intervention  Provide advice, education, support and counseling about physical activity/exercise needs.;Develop an individualized exercise prescription for aerobic and resistive training based on  initial evaluation findings, risk stratification, comorbidities and participant's personal goals.       Expected Outcomes  Short Term: Increase workloads from initial exercise prescription for resistance, speed, and METs.;Short Term: Perform resistance training exercises routinely during rehab and add in resistance training at home;Long Term: Improve cardiorespiratory fitness, muscular endurance and strength as measured by increased METs and functional capacity (6MWT)       Able to understand and use rate of perceived exertion (RPE) scale  Yes       Intervention  Provide education and explanation on how to use RPE scale       Expected Outcomes  Short Term: Able to use RPE daily in rehab to express subjective intensity level;Long Term:  Able to use RPE to guide intensity level when exercising independently       Knowledge and understanding of Target Heart Rate Range (THRR)  Yes       Intervention  Provide education and explanation of THRR including how the numbers were predicted and where they are located for reference       Expected Outcomes  Short Term: Able to state/look up THRR;Long Term: Able to use THRR to govern intensity when exercising independently;Short Term: Able to use daily as guideline for intensity in rehab       Able to check pulse independently  Yes       Intervention  Provide education and demonstration on how to check pulse in carotid and radial arteries.;Review the importance of being able to check your own pulse for safety during independent exercise       Expected Outcomes  Short Term: Able to explain why pulse checking is important during independent exercise;Long Term: Able to check pulse independently and accurately       Understanding of Exercise Prescription  Yes       Intervention  Provide education, explanation, and written materials on patient's individual exercise prescription       Expected Outcomes  Short Term: Able to explain program exercise prescription;Long Term:  Able to explain home exercise prescription to exercise independently          Exercise Goals Re-Evaluation :   Discharge Exercise Prescription (Final Exercise Prescription Changes):   Nutrition:  Target Goals: Understanding of nutrition guidelines, daily intake of sodium 1500mg , cholesterol 200mg , calories 30% from fat and 7% or less from saturated fats, daily to have 5 or more servings of fruits and vegetables.  Biometrics: Pre Biometrics - 08/09/19 1031      Pre Biometrics   Height  5\' 11"  (1.803 m)    Waist Circumference  34 inches    Hip Circumference  40.5 inches    Waist to Hip Ratio  0.84 %    BMI (Calculated)  23.01    Triceps Skinfold  19 mm    % Body Fat  23.9 %    Grip Strength  42 kg    Flexibility  0 in    Single Leg Stand  30 seconds        Nutrition Therapy Plan and Nutrition Goals:   Nutrition Assessments:   Nutrition Goals Re-Evaluation:   Nutrition Goals Re-Evaluation:   Nutrition Goals Discharge (Final Nutrition Goals Re-Evaluation):   Psychosocial: Target Goals: Acknowledge presence or absence of significant depression and/or stress, maximize coping skills, provide positive support system. Participant is able to verbalize types and ability to use techniques and skills needed for reducing stress and depression.  Initial Review & Psychosocial Screening: Initial Psych Review &  Screening - 08/09/19 1042      Initial Review   Current issues with  None Identified      Family Dynamics   Good Support System?  No   Colin has his wife and children     Barriers   Psychosocial barriers to participate in program  There are no identifiable barriers or psychosocial needs.      Screening Interventions   Interventions  Encouraged to exercise       Quality of Life Scores: Quality of Life - 08/09/19 1059      Quality of Life   Select  Quality of Life      Quality of Life Scores   Health/Function Pre  22.8 %    Socioeconomic Pre  22.75 %     Psych/Spiritual Pre  22.5 %    Family Pre  22.5 %    GLOBAL Pre  22.68 %      Scores of 19 and below usually indicate a poorer quality of life in these areas.  A difference of  2-3 points is a clinically meaningful difference.  A difference of 2-3 points in the total score of the Quality of Life Index has been associated with significant improvement in overall quality of life, self-image, physical symptoms, and general health in studies assessing change in quality of life.  PHQ-9: Recent Review Flowsheet Data    Depression screen [facility] 2/9 08/09/2019   Decreased Interest 0   Down, Depressed, Hopeless 0   PHQ - 2 Score 0     Interpretation of Total Score  Total Score Depression Severity:  1-4 = Minimal depression, 5-9 = Mild depression, 10-14 = Moderate depression, 15-19 = Moderately severe depression, 20-27 = Severe depression   Psychosocial Evaluation and Intervention:   Psychosocial Re-Evaluation:   Psychosocial Discharge (Final Psychosocial Re-Evaluation):   Vocational Rehabilitation: Provide vocational rehab assistance to qualifying candidates.   Vocational Rehab Evaluation & Intervention: Vocational Rehab - 08/09/19 1045      Initial Vocational Rehab Evaluation & Intervention   Assessment shows need for Vocational Rehabilitation  No      Vocational Rehab Re-Evaulation   Comments  Amil is retired and does not need vocational rehab at this time       Education: Education Goals: Education classes will be provided on a weekly basis, covering required topics. Participant will state understanding/return demonstration of topics presented.  Learning Barriers/Preferences: Learning Barriers/Preferences - 08/09/19 1213      Learning Barriers/Preferences   Learning Barriers  Sight    Learning Preferences  Written Material;Video;Skilled Demonstration;Pictoral       Education Topics: Count Your Pulse:  -Group instruction provided by verbal instruction, demonstration,  patient participation and written materials to support subject.  Instructors address importance of being able to find your pulse and how to count your pulse when at home without a heart monitor.  Patients get hands on experience counting their pulse with staff help and individually.   Heart Attack, Angina, and Risk Factor Modification:  -Group instruction provided by verbal instruction, video, and written materials to support subject.  Instructors address signs and symptoms of angina and heart attacks.    Also discuss risk factors for heart disease and how to make changes to improve heart health risk factors.   Functional Fitness:  -Group instruction provided by verbal instruction, demonstration, patient participation, and written materials to support subject.  Instructors address safety measures for doing things around the house.  Discuss how to get up and  down off the floor, how to pick things up properly, how to safely get out of a chair without assistance, and balance training.   Meditation and Mindfulness:  -Group instruction provided by verbal instruction, patient participation, and written materials to support subject.  Instructor addresses importance of mindfulness and meditation practice to help reduce stress and improve awareness.  Instructor also leads participants through a meditation exercise.    Stretching for Flexibility and Mobility:  -Group instruction provided by verbal instruction, patient participation, and written materials to support subject.  Instructors lead participants through series of stretches that are designed to increase flexibility thus improving mobility.  These stretches are additional exercise for major muscle groups that are typically performed during regular warm up and cool down.   Hands Only CPR:  -Group verbal, video, and participation provides a basic overview of AHA guidelines for community CPR. Role-play of emergencies allow participants the opportunity  to practice calling for help and chest compression technique with discussion of AED use.   Hypertension: -Group verbal and written instruction that provides a basic overview of hypertension including the most recent diagnostic guidelines, risk factor reduction with self-care instructions and medication management.    Nutrition I class: Heart Healthy Eating:  -Group instruction provided by PowerPoint slides, verbal discussion, and written materials to support subject matter. The instructor gives an explanation and review of the Therapeutic Lifestyle Changes diet recommendations, which includes a discussion on lipid goals, dietary fat, sodium, fiber, plant stanol/sterol esters, sugar, and the components of a well-balanced, healthy diet.   Nutrition II class: Lifestyle Skills:  -Group instruction provided by PowerPoint slides, verbal discussion, and written materials to support subject matter. The instructor gives an explanation and review of label reading, grocery shopping for heart health, heart healthy recipe modifications, and ways to make healthier choices when eating out.   Diabetes Question & Answer:  -Group instruction provided by PowerPoint slides, verbal discussion, and written materials to support subject matter. The instructor gives an explanation and review of diabetes co-morbidities, pre- and post-prandial blood glucose goals, pre-exercise blood glucose goals, signs, symptoms, and treatment of hypoglycemia and hyperglycemia, and foot care basics.   Diabetes Blitz:  -Group instruction provided by PowerPoint slides, verbal discussion, and written materials to support subject matter. The instructor gives an explanation and review of the physiology behind type 1 and type 2 diabetes, diabetes medications and rational behind using different medications, pre- and post-prandial blood glucose recommendations and Hemoglobin A1c goals, diabetes diet, and exercise including blood glucose  guidelines for exercising safely.    Portion Distortion:  -Group instruction provided by PowerPoint slides, verbal discussion, written materials, and food models to support subject matter. The instructor gives an explanation of serving size versus portion size, changes in portions sizes over the last 20 years, and what consists of a serving from each food group.   Stress Management:  -Group instruction provided by verbal instruction, video, and written materials to support subject matter.  Instructors review role of stress in heart disease and how to cope with stress positively.     Exercising on Your Own:  -Group instruction provided by verbal instruction, power point, and written materials to support subject.  Instructors discuss benefits of exercise, components of exercise, frequency and intensity of exercise, and end points for exercise.  Also discuss use of nitroglycerin and activating EMS.  Review options of places to exercise outside of rehab.  Review guidelines for sex with heart disease.   Cardiac Drugs I:  -Group  instruction provided by verbal instruction and written materials to support subject.  Instructor reviews cardiac drug classes: antiplatelets, anticoagulants, beta blockers, and statins.  Instructor discusses reasons, side effects, and lifestyle considerations for each drug class.   Cardiac Drugs II:  -Group instruction provided by verbal instruction and written materials to support subject.  Instructor reviews cardiac drug classes: angiotensin converting enzyme inhibitors (ACE-I), angiotensin II receptor blockers (ARBs), nitrates, and calcium channel blockers.  Instructor discusses reasons, side effects, and lifestyle considerations for each drug class.   Anatomy and Physiology of the Circulatory System:  Group verbal and written instruction and models provide basic cardiac anatomy and physiology, with the coronary electrical and arterial systems. Review of: AMI, Angina,  Valve disease, Heart Failure, Peripheral Artery Disease, Cardiac Arrhythmia, Pacemakers, and the ICD.   Other Education:  -Group or individual verbal, written, or video instructions that support the educational goals of the cardiac rehab program.   Holiday Eating Survival Tips:  -Group instruction provided by PowerPoint slides, verbal discussion, and written materials to support subject matter. The instructor gives patients tips, tricks, and techniques to help them not only survive but enjoy the holidays despite the onslaught of food that accompanies the holidays.   Knowledge Questionnaire Score: Knowledge Questionnaire Score - 08/09/19 1059      Knowledge Questionnaire Score   Pre Score  23/28       Core Components/Risk Factors/Patient Goals at Admission: Personal Goals and Risk Factors at Admission - 08/09/19 1213      Core Components/Risk Factors/Patient Goals on Admission    Weight Management  Yes;Weight Maintenance    Admit Weight  164 lb 14.5 oz (74.8 kg)    Diabetes  Yes    Intervention  Provide education about signs/symptoms and action to take for hypo/hyperglycemia.;Provide education about proper nutrition, including hydration, and aerobic/resistive exercise prescription along with prescribed medications to achieve blood glucose in normal ranges: Fasting glucose 65-99 mg/dL    Expected Outcomes  Short Term: Participant verbalizes understanding of the signs/symptoms and immediate care of hyper/hypoglycemia, proper foot care and importance of medication, aerobic/resistive exercise and nutrition plan for blood glucose control.;Long Term: Attainment of HbA1C < 7%.    Lipids  Yes    Intervention  Provide education and support for participant on nutrition & aerobic/resistive exercise along with prescribed medications to achieve LDL 70mg , HDL >40mg .    Expected Outcomes  Short Term: Participant states understanding of desired cholesterol values and is compliant with medications  prescribed. Participant is following exercise prescription and nutrition guidelines.;Long Term: Cholesterol controlled with medications as prescribed, with individualized exercise RX and with personalized nutrition plan. Value goals: LDL < 70mg , HDL > 40 mg.    Stress  Yes    Intervention  Offer individual and/or small group education and counseling on adjustment to heart disease, stress management and health-related lifestyle change. Teach and support self-help strategies.;Refer participants experiencing significant psychosocial distress to appropriate mental health specialists for further evaluation and treatment. When possible, include family members and significant others in education/counseling sessions.    Expected Outcomes  Short Term: Participant demonstrates changes in health-related behavior, relaxation and other stress management skills, ability to obtain effective social support, and compliance with psychotropic medications if prescribed.;Long Term: Emotional wellbeing is indicated by absence of clinically significant psychosocial distress or social isolation.       Core Components/Risk Factors/Patient Goals Review:    Core Components/Risk Factors/Patient Goals at Discharge (Final Review):    ITP Comments: ITP Comments  [place] Name 08/09/19 1042           ITP Comments  Dr. Fransico Him, Medical Director          Comments:Inman attended orientation on 08/09/2019 to review rules and guidelines for program.  Completed 6 minute walk test, Intitial ITP, and exercise prescription.  VSS. Telemetry-Sinus Rhythm with a tall t wave this has been previously documented.  Asymptomatic. Safety measures and social distancing in place per CDC guidelines.Barnet Pall, RN,BSN 08/09/2019 2:04 PM

## 2019-08-14 ENCOUNTER — Other Ambulatory Visit: Payer: Medicare HMO

## 2019-08-14 ENCOUNTER — Other Ambulatory Visit: Payer: Self-pay

## 2019-08-14 DIAGNOSIS — E781 Pure hyperglyceridemia: Secondary | ICD-10-CM | POA: Diagnosis not present

## 2019-08-14 LAB — LIPID PANEL
Chol/HDL Ratio: 3.2 ratio (ref 0.0–5.0)
Cholesterol, Total: 114 mg/dL (ref 100–199)
HDL: 36 mg/dL — ABNORMAL LOW (ref >39)
LDL Calculated: 61 mg/dL (ref 0–99)
Triglycerides: 87 mg/dL (ref 0–149)
VLDL Cholesterol Cal: 17 mg/dL (ref 5–40)

## 2019-08-15 ENCOUNTER — Telehealth: Payer: Self-pay | Admitting: Physician Assistant

## 2019-08-15 ENCOUNTER — Encounter (HOSPITAL_COMMUNITY)
Admission: RE | Admit: 2019-08-15 | Discharge: 2019-08-15 | Disposition: A | Discharge status: Home / Self Care | Payer: Medicare HMO | Source: Ambulatory Visit | Attending: Interventional Cardiology | Admitting: Interventional Cardiology

## 2019-08-15 DIAGNOSIS — Z955 Presence of coronary angioplasty implant and graft: Secondary | ICD-10-CM | POA: Diagnosis not present

## 2019-08-15 DIAGNOSIS — I214 Non-ST elevation (NSTEMI) myocardial infarction: Secondary | ICD-10-CM

## 2019-08-15 LAB — GLUCOSE, CAPILLARY: Glucose-Capillary: 204 mg/dL — ABNORMAL HIGH (ref 70–99)

## 2019-08-15 NOTE — Telephone Encounter (Signed)
   Billy Lawson called because pt had some discomfort during treadmill and wondered if it was cardiac.  Pt was on the treadmill, level "3", and developed bilateral axillary pain. The treadmill was stopped and the symptoms resolved. No associated sx.  ECG done and reviewed, previously inverted T waves are all upright. No acute ischemic changes, possible mild early repol.   Cath results reviewed.   Spoke w/ pt on speaker phone.   Advised that his anginal symptom was chest pain, he has not had that.  He had single vessel disease, with complete revascularization.  His ECG is improved, with no acute changes.  Advised that I have no evidence this is angina. Ok to keep exercising but decrease the level and increase very gradually.  Contact again if sx re-occur.    Diagnostic Dominance: Right  Intervention    Rosaria Ferries, PA-C 08/15/2019 2:39 PM Bee[facility] [phone]

## 2019-08-15 NOTE — Progress Notes (Signed)
Daily Session Note  Patient Details  Name: Billy Lawson MRN: [id number] Date of Birth: 12/20/1946 Referring Provider:     CARDIAC REHAB PHASE II ORIENTATION from 08/09/2019 in [place]  Referring Provider  Dr. Tamala Julian      Encounter Date: 08/15/2019  Check In: Session Check In - 08/15/19 1336      Check-In   Supervising physician immediately available to respond to emergencies  [facility] immediately available    Physician(s)  Dr. Benny Lennert    Location  [facility]    Staff Present  Rodney Langton, RN;Olinty Celesta Aver, MS, ACSM CEP, Exercise Physiologist; , RN, Toma Deiters, RN, BSN;Brittany Durene Fruits, BS, ACSM CEP, Exercise Physiologist    Virtual Visit  No    Medication changes reported      No    Fall or balance concerns reported     No    Tobacco Cessation  No Change    Warm-up and Cool-down  Performed on first and last piece of equipment    Resistance Training Performed  No    VAD Patient?  No    PAD/SET Patient?  No      Pain Assessment   Currently in Pain?  No/denies    Multiple Pain Sites  No       Capillary Blood Glucose: Results for orders placed or performed during the hospital encounter of 08/15/19 (from the past 24 hour(s))  Glucose, capillary     Status: Abnormal   Collection Time: 08/15/19  1:33 PM  Result Value Ref Range   Glucose-Capillary 204 (H) 70 - 99 mg/dL    Exercise Prescription Changes - 08/15/19 1500      Response to Exercise   Blood Pressure (Admit)  108/54    Blood Pressure (Exercise)  128/72    Blood Pressure (Exit)  122/70    Heart Rate (Admit)  74 bpm    Heart Rate (Exercise)  95 bpm    Heart Rate (Exit)  65 bpm    Symptoms  Bilateral underarm discomfort 2/10    Comments  Pt first day of CR program.    Duration  Continue with 30 min of aerobic exercise without signs/symptoms of physical distress.    Intensity  THRR unchanged      Progression   Progression   Continue to progress workloads to maintain intensity without signs/symptoms of physical distress.    Average METs  3.71      Resistance Training   Training Prescription  No      Interval Training   Interval Training  No      Treadmill   MPH  3    Grade  1    Minutes  8       Social History   Tobacco Use  Smoking Status Former Smoker  . Packs/day: 1.45  . Years: 54.00  . Pack years: 78.30  . Types: Cigarettes  . Start date: 12/21/1963  . Quit date: 01/21/2016  . Years since quitting: 3.5  Smokeless Tobacco Never Used  Tobacco Comment   USES [employer]     Goals Met:    Goals Unmet:  Complained of bilateral underarm pain  Comments: Pt started cardiac rehab today.  Pt tolerated light exercise without difficulty. VSS, telemetry-Sinus Rhythm tall T wave,   Medication list reconciled. Pt denies barriers to medicaiton compliance.  PSYCHOSOCIAL ASSESSMENT:  PHQ-0. Pt exhibits positive coping skills, hopeful outlook with supportive family. No psychosocial needs  identified at this time, no psychosocial interventions necessary.   Boomer reported having bilateral underarm and left sided chest discomfort on the treadmill. Exercise stopped. Nikash said his chest discomfort stopped when he stopped walking on the treadmill 12 lead ECG obtained. Oxygen saturation 100% on room air. Blood pressure 128/72. Heart rate 65.Rhonda Barrett PAC called and notified. Rhonda reviewed the 12 lead ECG.Suanne Marker said that Rett can resume exercise on his next scheduled visit.Will continue to monitor the patient throughout  the program.   Dr. Fransico Him is Medical Director for Cardiac Rehab at [facility].

## 2019-08-16 NOTE — Telephone Encounter (Signed)
Agree 

## 2019-08-17 ENCOUNTER — Encounter (HOSPITAL_COMMUNITY): Payer: Medicare HMO

## 2019-08-22 ENCOUNTER — Other Ambulatory Visit: Payer: Self-pay

## 2019-08-22 ENCOUNTER — Encounter (HOSPITAL_COMMUNITY)
Admission: RE | Admit: 2019-08-22 | Discharge: 2019-08-22 | Disposition: A | Discharge status: Home / Self Care | Payer: Medicare HMO | Source: Ambulatory Visit | Attending: Interventional Cardiology | Admitting: Interventional Cardiology

## 2019-08-22 DIAGNOSIS — Z955 Presence of coronary angioplasty implant and graft: Secondary | ICD-10-CM | POA: Diagnosis not present

## 2019-08-22 DIAGNOSIS — I214 Non-ST elevation (NSTEMI) myocardial infarction: Secondary | ICD-10-CM

## 2019-08-22 LAB — GLUCOSE, CAPILLARY: Glucose-Capillary: 124 mg/dL — ABNORMAL HIGH (ref 70–99)

## 2019-08-22 NOTE — Progress Notes (Signed)
Billy Lawson completed his second day of exercise at cardiac rehab without difficulty or complaints of chest pain. Billy Lawson worked at a lower work load and used a mask that made it easier for him to breath today. Will continue to monitor the patient throughout  the program.Maria Venetia Maxon, RN,BSN 08/22/2019 3:28 PM

## 2019-08-24 ENCOUNTER — Encounter (HOSPITAL_COMMUNITY)
Admission: RE | Admit: 2019-08-24 | Discharge: 2019-08-24 | Disposition: A | Discharge status: Home / Self Care | Payer: Medicare HMO | Source: Ambulatory Visit | Attending: Interventional Cardiology | Admitting: Interventional Cardiology

## 2019-08-24 ENCOUNTER — Other Ambulatory Visit: Payer: Self-pay

## 2019-08-24 DIAGNOSIS — I214 Non-ST elevation (NSTEMI) myocardial infarction: Secondary | ICD-10-CM | POA: Diagnosis not present

## 2019-08-24 DIAGNOSIS — Z955 Presence of coronary angioplasty implant and graft: Secondary | ICD-10-CM

## 2019-08-24 NOTE — Progress Notes (Signed)
Patient reports that when he walks on the treadmill he still feels discomfort in both of his shoulders and that he feels the discomfort under both of his arms. Blood pressure 132/70. Oxygen saturation 99% on room air.Telemetry rhythm Sinus Rhythm. Billy Lawson denies having any actual chest pain but says he notices the discomfort in his shoulders when he is working outside in the yard. Billy Kicks NP paged and notified. Will try a different piece of exercise equipment instead of the treadmill and see how the patient responds. Billy Lawson had no further complaints for the remainder of exercise at cardiac rehab.Will continue to monitor the patient throughout  the program.Billy Venetia Maxon, RN,BSN 08/24/2019 2:27 PM

## 2019-08-26 NOTE — Progress Notes (Signed)
Cardiology Office Note   Date:  08/29/2019   ID:  Billy Lawson, DOB 1946/10/17, MRN [id number]  PCP:  Lawerance Cruel, MD  Cardiologist: Dr. Daneen Schick, MD  Chief Complaint  Patient presents with   Lawson   History of Present Illness: Billy Lawson is a 73 y.o. male who presents for Lawson of CAD, Billy Lawson.  Billy Lawson has a prior history of non-STEMI and 02/07/2019 with DES to LAD placement, hyperlipidemia and DM 2.  He was last Billy by Dr. Tamala Lawson on 04/20/2019 in [place].  At that time, he reported that his dyspnea had resolved and he had no chest pain, orthopnea or PND. He recognized the DM2 as a cardiac risk factor but was reluctant about medical therapy. He was not exercising regularly. Overall he was doing well.  He had stopped smoking.  I last saw him on 06/11/2019 in which he stated that his energy was initially improved after his stent placement however over the last 3 to 4 weeks has noticed a mild decrease in his energy level.  He denied shortness of breath, PND, LE swelling or orthopnea symptoms. At that time he continued to work in the yard and exercise without anginal symptoms. He had a brief episode of a chest "twing" that lasted only a short time and was during increased stress. He stated that the sensation dissipated on its own without the use of SL NTG. He was resting during the time. He stated it was difficult for him to determine whether his symptoms are psychological or in fact "real" symptoms.   On 08/15/2019 patient was participating in cardiac rehab and walking on a treadmill when he began having bilateral underarm and left-sided chest discomfort. Exercise stopped. He had no associated shortness of breath, diaphoresis or nausea or vomiting.  EKG performed with no acute ischemic changes. Cath results and EKG reviewed by Rosaria Ferries, PA who was called for the episode.  Patient was advised that there was no evidence that this was  ischemia and it was okay to keep exercising but to decrease the level of intensity and increase very gradually.  He was to contact if recurrent symptoms.  Dr. Tamala Lawson notified with agreement.  Billy Lawson, Billy Lawson and he reports he is doing well from a cardiac perspective.  We discussed the above incident.  He states that while on the treadmill he was gripping the side rails fairly tightly he began having bilateral shoulder and chest discomfort which he states was not "pain ".  He has known frozen shoulder and was initially contributing his symptoms to that as they would dissipate with movement.  He reports no similar symptoms while on the stationary bicycle.  He had no associated symptoms such as nausea, vomiting or diaphoresis.  He reports he will also get this sensation while mowing his grass or walking up a steep incline which sound like angina.  He has not tried nitroglycerin.  He is very reluctant to start long-acting nitrates at this time.  He reports this does not feel similar to his MI and is not concerned for an acute event.  At this time, he would like to monitor symptoms and back off on activity when he feels this sensation.  We discussed ED precautions and contacting our office if symptoms worsen or become more frequent.  At that time, he may consider low-dose Imdur 15 mg daily.  He  reports complete compliance with ASA, Plavix.  He is not currently interested in further ischemic evaluation.  He denies shortness of breath, LE swelling, dizziness, palpitations, fatigue or syncope.  Overall is very knowledgeable about CAD and symptoms.  Past Medical History:  Diagnosis Date   Allergic rhinitis    COPD (chronic obstructive pulmonary disease) ([place])    Coronary artery disease    Diabetes mellitus without complication ([place])    Hyperlipidemia    Pneumonia    once in his 30s & was hospitalized without intubation    Past Surgical History:  Procedure  Laterality Date   [place] INTERVENTION  02/07/2019   CORONARY STENT INTERVENTION N/A 02/07/2019   Procedure: CORONARY STENT INTERVENTION;  Surgeon: Wellington Hampshire, MD;  Location: [facility] [place] [facility];  Service: Cardiovascular;  Laterality: N/A;   INTRAVASCULAR ULTRASOUND/IVUS N/A 02/07/2019   Procedure: Intravascular Ultrasound/IVUS;  Surgeon: Wellington Hampshire, MD;  Location: [place] [facility];  Service: Cardiovascular;  Laterality: N/A;   LEFT HEART CATH AND CORONARY ANGIOGRAPHY N/A 02/07/2019   Procedure: LEFT HEART CATH AND CORONARY ANGIOGRAPHY;  Surgeon: Wellington Hampshire, MD;  Location: [place] [facility];  Service: Cardiovascular;  Laterality: N/A;     Current Outpatient Medications  Medication Sig Dispense Refill   aspirin EC 81 MG EC tablet Take 1 tablet (81 mg total) by mouth daily. 90 tablet 3   atorvastatin (LIPITOR) 80 MG tablet Take 1 tablet (80 mg total) by mouth daily at 6 PM. 30 tablet 11   carvedilol (COREG) 3.125 MG tablet Take 1 tablet (3.125 mg total) by mouth 2 (two) times daily with a meal. 60 tablet 11   clopidogrel (PLAVIX) 75 MG tablet Take 1 tablet (75 mg total) by mouth daily. 90 tablet 3   losartan (COZAAR) 25 MG tablet Take 0.5 tablets (12.5 mg total) by mouth daily. 45 tablet 3   nitroGLYCERIN (NITROSTAT) 0.4 MG SL tablet Place 1 tablet (0.4 mg total) under the tongue every 5 (five) minutes x 3 doses as needed for chest pain. 28 tablet 1   No current facility-administered medications for this visit.     Allergies:   Patient has no known allergies.    Social History:  The patient  reports that he quit smoking about 3 years ago. His smoking use included cigarettes. He started smoking about 55 years ago. He has a 78.30 pack-year smoking history. He has never used smokeless tobacco. He reports that he does not drink alcohol or use drugs.   Family History:  The patient's family  history includes Aortic aneurysm in his paternal grandfather; Cancer in his brother; Diabetes in his mother and paternal grandmother; Hypertension in his mother; Lymphoma in his father.    ROS:  Please see the history of present illness. Otherwise, review of systems are positive for none.   All other systems are reviewed and negative.    PHYSICAL EXAM: VS:  BP 114/68    Pulse 66    Ht 5\' 11"  (1.803 m)    Wt 163 lb (73.9 kg)    SpO2 96%    BMI 22.73 kg/m  , BMI Body mass index is 22.73 kg/m.   General: Well developed, well nourished, NAD Neck: Negative for carotid bruits. No JVD Lungs:Clear to ausculation bilaterally. No wheezes, rales, or rhonchi. Breathing is unlabored. Cardiovascular: RRR with S1 S2. No murmur Extremities: No edema. No clubbing or cyanosis.  DP pulses 2+ bilaterally Neuro: Alert and oriented. No focal deficits. No facial asymmetry. MAE spontaneously. Psych: Responds to questions appropriately with normal affect.     EKG:  EKG is not ordered Billy.   Recent Labs: 02/06/2019: TSH 2.205 02/08/2019: Hemoglobin 12.9; Platelets 156 02/19/2019: BUN 20; Creatinine, Ser 1.15; Potassium 4.2; Sodium 138 06/11/2019: ALT 27    Lipid Panel    Component Value Date/Time   CHOL 114 08/14/2019 1032   TRIG 87 08/14/2019 1032   HDL 36 (L) 08/14/2019 1032   CHOLHDL 3.2 08/14/2019 1032   CHOLHDL 3.9 02/07/2019 0438   VLDL 8 02/07/2019 0438   LDLCALC 61 08/14/2019 1032     Wt Readings from Last 3 Encounters:  08/29/19 163 lb (73.9 kg)  08/09/19 164 lb 14.5 oz (74.8 kg)  06/11/19 164 lb 12.8 oz (74.8 kg)      Other studies Reviewed: Additional studies/ records that were reviewed Billy include:  CATH / PCI 02/07/19: Diagnostic  Dominance: Right   Intervention         ASSESSMENT AND PLAN:  1.  CAD s/p STEMI with DES/PCI to LAD/stable angina: -Has repeated complaints of bilateral shoulder discomfort when exercising during cardiac rehabilitation, mowing the  grass and walking up inclines. Multiple medical providers have been notified.  No EKG changes and therefore it is recommended that he may continue with cardiac rehab. -Discussed initiation of Imdur 15 mg however patient very reluctant to start an additional medication and declines further ischemic evaluation.  He wants to continue to monitor symptoms and back off of exertion when symptoms occur.  If symptoms worsen or become severe like his last MI, patient is to go to the ED for further evaluation.  -Unable to titrate beta-blocker secondary to soft BP -Continue ASA, Plavix, high intensity atorvastatin, carvedilol, losartan -Lawson echocardiogram given last LVEF noted to be 45 to 50% with anterior apical hypokinesis scheduled for 06/08/2019 -Plans for dual antiplatelet therapy for at least 1 year -No c/o acute blood in stool or urine   2.  Essential hypertension : -Stable,  114/68 -Continue losartan to 12.5 mg daily -Continue carvedilol  3.  HLD: -LDL, 61 on 08/14/2019 -Continue high intensity atorvastatin  4.  DM2: -Last hemoglobin A1c, 7.0 -Reports better blood glucose levels with home monitoring   5. Mild dilation of the aortic root: -Found per echocardiogram 06/08/2019 -Mild dilatation of the aortic root measuring 38 mm -We will need to follow with serial echocardiograms and maintain good BP control   Current medicines are reviewed at length with the patient Billy.  The patient does not have concerns regarding medicines.  The following changes have been made:  no change  Labs/ tests ordered Billy include: None  No orders of the defined types were placed in this encounter.  Disposition:   FU with Dr. Tamala [facility] in 3 months or sooner if needed  Signed, Kathyrn Drown, NP  08/29/2019 [facility] [place], [place], [place]  [address] Phone: [phone]; Fax: [phone]

## 2019-08-29 ENCOUNTER — Other Ambulatory Visit: Payer: Self-pay

## 2019-08-29 ENCOUNTER — Encounter: Payer: Self-pay | Admitting: Cardiology

## 2019-08-29 ENCOUNTER — Ambulatory Visit (INDEPENDENT_AMBULATORY_CARE_PROVIDER_SITE_OTHER): Payer: Medicare HMO | Admitting: Cardiology

## 2019-08-29 ENCOUNTER — Encounter (HOSPITAL_COMMUNITY)
Admission: RE | Admit: 2019-08-29 | Discharge: 2019-08-29 | Disposition: A | Discharge status: Home / Self Care | Payer: Medicare HMO | Source: Ambulatory Visit | Attending: Interventional Cardiology | Admitting: Interventional Cardiology

## 2019-08-29 VITALS — BP 114/68 | HR 66 | Ht 71.0 in | Wt 163.0 lb

## 2019-08-29 DIAGNOSIS — Z955 Presence of coronary angioplasty implant and graft: Secondary | ICD-10-CM | POA: Diagnosis not present

## 2019-08-29 DIAGNOSIS — E785 Hyperlipidemia, unspecified: Secondary | ICD-10-CM

## 2019-08-29 DIAGNOSIS — I208 Other forms of angina pectoris: Secondary | ICD-10-CM

## 2019-08-29 DIAGNOSIS — I214 Non-ST elevation (NSTEMI) myocardial infarction: Secondary | ICD-10-CM

## 2019-08-29 DIAGNOSIS — R69 Illness, unspecified: Secondary | ICD-10-CM | POA: Diagnosis not present

## 2019-08-29 DIAGNOSIS — I2510 Atherosclerotic heart disease of native coronary artery without angina pectoris: Secondary | ICD-10-CM | POA: Diagnosis not present

## 2019-08-29 DIAGNOSIS — E781 Pure hyperglyceridemia: Secondary | ICD-10-CM | POA: Diagnosis not present

## 2019-08-29 DIAGNOSIS — I10 Essential (primary) hypertension: Secondary | ICD-10-CM

## 2019-08-29 NOTE — Patient Instructions (Signed)
Medication Instructions:   Your physician recommends that you continue on your current medications as directed. Please refer to the Current Medication list given to you today.  If you need a refill on your cardiac medications before your next appointment, please call your pharmacy.   Lab work:  None ordered today  If you have labs (blood work) drawn today and your tests are completely normal, you will receive your results only by: Marland Kitchen MyChart Message (if you have MyChart) OR . A paper copy in the mail If you have any lab test that is abnormal or we need to change your treatment, we will call you to review the results.  Testing/Procedures:  None ordered today  Follow-Up:  On 11/28/19 at 3:20PM with Dr. Daneen Schick

## 2019-08-31 ENCOUNTER — Other Ambulatory Visit: Payer: Self-pay

## 2019-08-31 ENCOUNTER — Encounter (HOSPITAL_COMMUNITY)
Admission: RE | Admit: 2019-08-31 | Discharge: 2019-08-31 | Disposition: A | Discharge status: Home / Self Care | Payer: Medicare HMO | Source: Ambulatory Visit | Attending: Interventional Cardiology | Admitting: Interventional Cardiology

## 2019-08-31 DIAGNOSIS — I214 Non-ST elevation (NSTEMI) myocardial infarction: Secondary | ICD-10-CM | POA: Diagnosis not present

## 2019-08-31 DIAGNOSIS — Z955 Presence of coronary angioplasty implant and graft: Secondary | ICD-10-CM | POA: Diagnosis not present

## 2019-08-31 NOTE — Progress Notes (Signed)
I have reviewed a Home Exercise Prescription with Katrine Coho Ormer . Param is not currently exercising at home.  The patient was advised to walk 5-7 days a week for 30-45 minutes.  Jeray and I discussed how to progress their exercise prescription.  The patient stated that their goals were to start walking at home for 30-45 minutes. Pt stated he is planning on going back to the gym. Encouraged Pt to start exercising on the days he does not come to [facility].  The patient stated that they understand the exercise prescription.  We reviewed exercise guidelines, target heart rate during exercise, RPE Scale, weather conditions, NTG use, endpoints for exercise, warmup and cool down.  Patient is encouraged to come to me with any questions. I will continue to follow up with the patient to assist them with progression and safety.    Deitra Mayo BS, ACSM CEP 08/31/2019 4:08 PM

## 2019-09-04 DIAGNOSIS — E785 Hyperlipidemia, unspecified: Secondary | ICD-10-CM | POA: Diagnosis not present

## 2019-09-04 DIAGNOSIS — Z125 Encounter for screening for malignant neoplasm of prostate: Secondary | ICD-10-CM | POA: Diagnosis not present

## 2019-09-04 DIAGNOSIS — E1169 Type 2 diabetes mellitus with other specified complication: Secondary | ICD-10-CM | POA: Diagnosis not present

## 2019-09-05 ENCOUNTER — Encounter (HOSPITAL_COMMUNITY)
Admission: RE | Admit: 2019-09-05 | Discharge: 2019-09-05 | Disposition: A | Discharge status: Home / Self Care | Payer: Medicare HMO | Source: Ambulatory Visit | Attending: Interventional Cardiology | Admitting: Interventional Cardiology

## 2019-09-05 ENCOUNTER — Other Ambulatory Visit: Payer: Self-pay

## 2019-09-05 DIAGNOSIS — I214 Non-ST elevation (NSTEMI) myocardial infarction: Secondary | ICD-10-CM | POA: Diagnosis not present

## 2019-09-05 DIAGNOSIS — Z955 Presence of coronary angioplasty implant and graft: Secondary | ICD-10-CM

## 2019-09-06 DIAGNOSIS — R69 Illness, unspecified: Secondary | ICD-10-CM | POA: Diagnosis not present

## 2019-09-06 NOTE — Progress Notes (Signed)
Cardiac Individual Treatment Plan  Patient Details  Name: Billy Lawson MRN: [id number] Date of Birth: 1946-04-05 Referring Provider:     CARDIAC REHAB PHASE II ORIENTATION from 08/09/2019 in [place]  Referring Provider  Dr. Tamala Julian      Initial Encounter Date:    CARDIAC REHAB PHASE II ORIENTATION from 08/09/2019 in [place]  Date  08/09/19      Visit Diagnosis: NSTEMI (non-ST elevated myocardial infarction) ([place]) 02/06/19  Status post coronary artery stent placement 02/07/19  Patient's Home Medications on Admission:  Current Outpatient Medications:  .  aspirin EC 81 MG EC tablet, Take 1 tablet (81 mg total) by mouth daily., Disp: 90 tablet, Rfl: 3 .  atorvastatin (LIPITOR) 80 MG tablet, Take 1 tablet (80 mg total) by mouth daily at 6 PM., Disp: 30 tablet, Rfl: 11 .  carvedilol (COREG) 3.125 MG tablet, Take 1 tablet (3.125 mg total) by mouth 2 (two) times daily with a meal., Disp: 60 tablet, Rfl: 11 .  clopidogrel (PLAVIX) 75 MG tablet, Take 1 tablet (75 mg total) by mouth daily., Disp: 90 tablet, Rfl: 3 .  losartan (COZAAR) 25 MG tablet, Take 0.5 tablets (12.5 mg total) by mouth daily., Disp: 45 tablet, Rfl: 3 .  nitroGLYCERIN (NITROSTAT) 0.4 MG SL tablet, Place 1 tablet (0.4 mg total) under the tongue every 5 (five) minutes x 3 doses as needed for chest pain., Disp: 28 tablet, Rfl: 1  Past Medical History: Past Medical History:  Diagnosis Date  . Allergic rhinitis   . COPD (chronic obstructive pulmonary disease) ([place])   . Coronary artery disease   . Diabetes mellitus without complication ([place])   . Hyperlipidemia   . Pneumonia    once in his 43s & was hospitalized without intubation    Tobacco Use: Social History   Tobacco Use  Smoking Status Former Smoker  . Packs/day: 1.45  . Years: 54.00  . Pack years: 78.30  . Types: Cigarettes  . Start date: 12/21/1963  . Quit date: 01/21/2016  . Years since  quitting: 3.6  Smokeless Tobacco Never Used  Tobacco Comment   USES NICKORET GUM     Labs: Recent Review Flowsheet Data    Labs for ITP Cardiac and Pulmonary Rehab Latest Ref Rng & Units 02/06/2019 02/07/2019 06/11/2019 08/14/2019   Cholestrol 100 - 199 mg/dL - 174 117 114   LDLCALC 0 - 99 mg/dL - 121(H) 51 61   HDL >39 mg/dL - 45 36(L) 36(L)   Trlycerides 0 - 149 mg/dL - 38 150(H) 87   Hemoglobin A1c 4.8 - 5.6 % 7.0(H) - - -      Capillary Blood Glucose: Lab Results  Component Value Date   GLUCAP 124 (H) 08/22/2019   GLUCAP 204 (H) 08/15/2019   GLUCAP 128 (H) 02/08/2019   GLUCAP 165 (H) 02/07/2019   GLUCAP 138 (H) 02/07/2019     Exercise Target Goals: Exercise Program Goal: Individual exercise prescription set using results from initial 6 min walk test and THRR while considering  patient's activity barriers and safety.   Exercise Prescription Goal: Starting with aerobic activity 30 plus minutes a day, 3 days per week for initial exercise prescription. Provide home exercise prescription and guidelines that participant acknowledges understanding prior to discharge.  Activity Barriers & Risk Stratification: Activity Barriers & Cardiac Risk Stratification - 08/09/19 1208      Activity Barriers & Cardiac Risk Stratification   Activity Barriers  None    Cardiac Risk Stratification  High       6 Minute Walk: 6 Minute Walk    Row Name 08/09/19 1207         6 Minute Walk   Phase  Initial     Distance  1610 feet     Walk Time  6 minutes     # of Rest Breaks  0     MPH  3     METS  3.5     RPE  12     Perceived Dyspnea   0     VO2 Peak  12.43     Symptoms  No     Resting HR  72 bpm     Resting BP  118/60     Resting Oxygen Saturation   99 %     Exercise Oxygen Saturation  during 6 min walk  99 %     Max Ex. HR  88 bpm     Max Ex. BP  128/70     2 Minute Post BP  116/64        Oxygen Initial Assessment:   Oxygen Re-Evaluation:   Oxygen Discharge (Final  Oxygen Re-Evaluation):   Initial Exercise Prescription: Initial Exercise Prescription - 08/09/19 1200      Date of Initial Exercise RX and Referring Provider   Date  08/09/19    Referring Provider  Dr. Tamala Julian    Expected Discharge Date  09/14/19      Treadmill   MPH  3    Grade  1    Minutes  15      NuStep   Level  3    SPM  85    Minutes  15    METs  3      Prescription Details   Frequency (times per week)  3    Duration  Progress to 30 minutes of continuous aerobic without signs/symptoms of physical distress      Intensity   THRR 40-80% of Max Heartrate  59-118    Ratings of Perceived Exertion  11-13      Progression   Progression  Continue to progress workloads to maintain intensity without signs/symptoms of physical distress.      Resistance Training   Training Prescription  Yes    Weight  4 lbs.     Reps  10-15       Perform Capillary Blood Glucose checks as needed.  Exercise Prescription Changes: Exercise Prescription Changes    Row Name 08/15/19 1500 08/22/19 1400 08/31/19 1600 09/05/19 1330       Response to Exercise   Blood Pressure (Admit)  108/54  130/68  122/78  120/64    Blood Pressure (Exercise)  128/72  122/70  128/66  122/60    Blood Pressure (Exit)  122/70  112/52  122/62  104/60    Heart Rate (Admit)  74 bpm  77 bpm  71 bpm  69 bpm    Heart Rate (Exercise)  95 bpm  101 bpm  82 bpm  90 bpm    Heart Rate (Exit)  65 bpm  75 bpm  78 bpm  78 bpm    Rating of Perceived Exertion (Exercise)  -  '13  12  12    '$ Symptoms  Bilateral underarm discomfort 2/10  None  None  None    Comments  Pt first day of CR program.  Pt tolerated exercise Rx well.   -  -  Duration  Continue with 30 min of aerobic exercise without signs/symptoms of physical distress.  Continue with 30 min of aerobic exercise without signs/symptoms of physical distress.  Continue with 30 min of aerobic exercise without signs/symptoms of physical distress.  Continue with 30 min of aerobic  exercise without signs/symptoms of physical distress.    Intensity  THRR unchanged  THRR unchanged  THRR unchanged  THRR unchanged      Progression   Progression  Continue to progress workloads to maintain intensity without signs/symptoms of physical distress.  Continue to progress workloads to maintain intensity without signs/symptoms of physical distress.  Continue to progress workloads to maintain intensity without signs/symptoms of physical distress.  Continue to progress workloads to maintain intensity without signs/symptoms of physical distress.    Average METs  3.71  2.8  2.8  2.9      Resistance Training   Training Prescription  No  No  Yes  No    Weight  -  -  4 lbs.   -    Reps  -  -  10-15  -    Time  -  -  10 Minutes  -      Interval Training   Interval Training  No  No  No  No      Treadmill   MPH  3  2.6  -  -    Grade  1  1  -  -    Minutes  8  15  -  -      Recumbant Bike   Level  -  -  2  2    Watts  -  -  28  29    Minutes  -  -  15  15    METs  -  -  -  3.1      NuStep   Level  -  '3  3  3    '$ SPM  -  85  85  85    Minutes  -  '15  15  15    '$ METs  -  2.4  2.9  2.8      Home Exercise Plan   Plans to continue exercise at  -  -  Home (comment)  Home (comment)    Frequency  -  -  Add 3 additional days to program exercise sessions.  Add 3 additional days to program exercise sessions.    Initial Home Exercises Provided  -  -  08/31/19  08/31/19       Exercise Comments: Exercise Comments    Row Name 08/15/19 1508 08/22/19 1455 08/31/19 1610 09/06/19 1449     Exercise Comments  Pt first day of exercise in CR program. Pt experienced pain. Nurse notified.  Pt first full day of exercise in CR program. Tolerated exercise well.  Reviewed HEP with Pt. Pt understands goals and exercise Rx.  Pt is tolerating exercise Rx well and progressing gradually.       Exercise Goals and Review: Exercise Goals    Row Name 08/09/19 1212             Exercise Goals    Increase Physical Activity  Yes       Intervention  Provide advice, education, support and counseling about physical activity/exercise needs.;Develop an individualized exercise prescription for aerobic and resistive training based on initial evaluation findings, risk stratification, comorbidities and participant's personal goals.       Expected Outcomes  Short Term: Attend rehab on a regular basis to increase amount of physical activity.;Long Term: Add in home exercise to make exercise part of routine and to increase amount of physical activity.;Long Term: Exercising regularly at least 3-5 days a week.       Increase Strength and Stamina  Yes       Intervention  Provide advice, education, support and counseling about physical activity/exercise needs.;Develop an individualized exercise prescription for aerobic and resistive training based on initial evaluation findings, risk stratification, comorbidities and participant's personal goals.       Expected Outcomes  Short Term: Increase workloads from initial exercise prescription for resistance, speed, and METs.;Short Term: Perform resistance training exercises routinely during rehab and add in resistance training at home;Long Term: Improve cardiorespiratory fitness, muscular endurance and strength as measured by increased METs and functional capacity (6MWT)       Able to understand and use rate of perceived exertion (RPE) scale  Yes       Intervention  Provide education and explanation on how to use RPE scale       Expected Outcomes  Short Term: Able to use RPE daily in rehab to express subjective intensity level;Long Term:  Able to use RPE to guide intensity level when exercising independently       Knowledge and understanding of Target Heart Rate Range (THRR)  Yes       Intervention  Provide education and explanation of THRR including how the numbers were predicted and where they are located for reference       Expected Outcomes  Short Term: Able to  state/look up THRR;Long Term: Able to use THRR to govern intensity when exercising independently;Short Term: Able to use daily as guideline for intensity in rehab       Able to check pulse independently  Yes       Intervention  Provide education and demonstration on how to check pulse in carotid and radial arteries.;Review the importance of being able to check your own pulse for safety during independent exercise       Expected Outcomes  Short Term: Able to explain why pulse checking is important during independent exercise;Long Term: Able to check pulse independently and accurately       Understanding of Exercise Prescription  Yes       Intervention  Provide education, explanation, and written materials on patient's individual exercise prescription       Expected Outcomes  Short Term: Able to explain program exercise prescription;Long Term: Able to explain home exercise prescription to exercise independently          Exercise Goals Re-Evaluation : Exercise Goals Re-Evaluation    Row Name 08/15/19 1505 08/22/19 1453 08/31/19 1608 09/06/19 1446       Exercise Goal Re-Evaluation   Exercise Goals Review  Increase Physical Activity;Understanding of Exercise Prescription;Increase Strength and Stamina;Knowledge and understanding of Target Heart Rate Range (THRR);Able to understand and use rate of perceived exertion (RPE) scale  Increase Physical Activity;Increase Strength and Stamina;Able to understand and use rate of perceived exertion (RPE) scale;Knowledge and understanding of Target Heart Rate Range (THRR);Understanding of Exercise Prescription  Increase Physical Activity;Increase Strength and Stamina;Able to understand and use rate of perceived exertion (RPE) scale;Knowledge and understanding of Target Heart Rate Range (THRR);Able to check pulse independently;Understanding of Exercise Prescription  Increase Physical Activity;Increase Strength and Stamina;Able to understand and use rate of perceived  exertion (RPE) scale;Knowledge and understanding of Target Heart Rate Range (THRR);Able to check pulse  independently;Understanding of Exercise Prescription    Comments  Pt first day of exercise in CR program. Pt experienced bilateral underarm discomfort, 2/10 while walking on the treadmill for 8 minutes at 3.0 mph. Pt stopped exercising and nurse was notified. Nurse ordered 12-lead EKG. Will follow up with Pt.  Pt first full day of exercise in CR program. Pt tolerated exercise Rx well. Pt understands THRR, RPE scale, and exercise Rx.  Reviewed HEP with Pt. Pt understands exercise Rx, RPE sclae, THRR, end points of exercise, weather precautions, and NTG use. Pt stated he is not currently exercising at home but was going to start walking 5-7 days per week for 30-45 minutes a day. he also stated he would like to go back to the gym.  Pt is tolerating exercise Rx well and is progressing well in CR program. Pt is increasing workloads and MET level with each session. MET level is 2.9. Pt was encouraged to start exercising at home in addition to CR program. Pt states he is planning to go back to the gym to start exercising again.    Expected Outcomes  Will continue to monitor and progress Pt as tolerated.  Will continue to monitor and progress Pt as tolerated.  Will continue to monitor and progress Pt as tolerated.  Will continue to monitor and progress Pt as tolerated.        Discharge Exercise Prescription (Final Exercise Prescription Changes): Exercise Prescription Changes - 09/05/19 1330      Response to Exercise   Blood Pressure (Admit)  120/64    Blood Pressure (Exercise)  122/60    Blood Pressure (Exit)  104/60    Heart Rate (Admit)  69 bpm    Heart Rate (Exercise)  90 bpm    Heart Rate (Exit)  78 bpm    Rating of Perceived Exertion (Exercise)  12    Symptoms  None    Duration  Continue with 30 min of aerobic exercise without signs/symptoms of physical distress.    Intensity  THRR unchanged       Progression   Progression  Continue to progress workloads to maintain intensity without signs/symptoms of physical distress.    Average METs  2.9      Resistance Training   Training Prescription  No      Interval Training   Interval Training  No      Recumbant Bike   Level  2    Watts  29    Minutes  15    METs  3.1      NuStep   Level  3    SPM  85    Minutes  15    METs  2.8      Home Exercise Plan   Plans to continue exercise at  Home (comment)    Frequency  Add 3 additional days to program exercise sessions.    Initial Home Exercises Provided  08/31/19       Nutrition:  Target Goals: Understanding of nutrition guidelines, daily intake of sodium '1500mg'$ , cholesterol '200mg'$ , calories 30% from fat and 7% or less from saturated fats, daily to have 5 or more servings of fruits and vegetables.  Biometrics: Pre Biometrics - 08/09/19 1031      Pre Biometrics   Height  '5\' 11"'$  (1.803 m)    Waist Circumference  34 inches    Hip Circumference  40.5 inches    Waist to Hip Ratio  0.84 %    BMI (Calculated)  23.01    Triceps Skinfold  19 mm    % Body Fat  23.9 %    Grip Strength  42 kg    Flexibility  0 in    Single Leg Stand  30 seconds        Nutrition Therapy Plan and Nutrition Goals:   Nutrition Assessments:   Nutrition Goals Re-Evaluation:   Nutrition Goals Discharge (Final Nutrition Goals Re-Evaluation):   Psychosocial: Target Goals: Acknowledge presence or absence of significant depression and/or stress, maximize coping skills, provide positive support system. Participant is able to verbalize types and ability to use techniques and skills needed for reducing stress and depression.  Initial Review & Psychosocial Screening: Initial Psych Review & Screening - 08/09/19 1042      Initial Review   Current issues with  None Identified      Family Dynamics   Good Support System?  No   Izmael has his wife and children     Barriers   Psychosocial  barriers to participate in program  There are no identifiable barriers or psychosocial needs.      Screening Interventions   Interventions  Encouraged to exercise       Quality of Life Scores: Quality of Life - 08/09/19 1059      Quality of Life   Select  Quality of Life      Quality of Life Scores   Health/Function Pre  22.8 %    Socioeconomic Pre  22.75 %    Psych/Spiritual Pre  22.5 %    Family Pre  22.5 %    GLOBAL Pre  22.68 %      Scores of 19 and below usually indicate a poorer quality of life in these areas.  A difference of  2-3 points is a clinically meaningful difference.  A difference of 2-3 points in the total score of the Quality of Life Index has been associated with significant improvement in overall quality of life, self-image, physical symptoms, and general health in studies assessing change in quality of life.  PHQ-9: Recent Review Flowsheet Data    Depression screen [facility] 2/9 08/09/2019   Decreased Interest 0   Down, Depressed, Hopeless 0   PHQ - 2 Score 0     Interpretation of Total Score  Total Score Depression Severity:  1-4 = Minimal depression, 5-9 = Mild depression, 10-14 = Moderate depression, 15-19 = Moderately severe depression, 20-27 = Severe depression   Psychosocial Evaluation and Intervention:   Psychosocial Re-Evaluation: Psychosocial Re-Evaluation    [place] Name 09/06/19 1544             Psychosocial Re-Evaluation   Current issues with  None Identified       Interventions  Encouraged to attend Cardiac Rehabilitation for the exercise       Continue Psychosocial Services   No Follow up required          Psychosocial Discharge (Final Psychosocial Re-Evaluation): Psychosocial Re-Evaluation - 09/06/19 1544      Psychosocial Re-Evaluation   Current issues with  None Identified    Interventions  Encouraged to attend Cardiac Rehabilitation for the exercise    Continue Psychosocial Services   No Follow up required       Vocational  Rehabilitation: Provide vocational rehab assistance to qualifying candidates.   Vocational Rehab Evaluation & Intervention: Vocational Rehab - 08/09/19 1045      Initial Vocational Rehab Evaluation & Intervention   Assessment shows need for Vocational Rehabilitation  No  Vocational Rehab Re-[facility]   Comments  Davione is retired and does not need vocational rehab at this time       Education: Education Goals: Education classes will be provided on a weekly basis, covering required topics. Participant will state understanding/return demonstration of topics presented.  Learning Barriers/Preferences: Learning Barriers/Preferences - 08/09/19 1213      Learning Barriers/Preferences   Learning Barriers  Sight    Learning Preferences  Written Material;Video;Skilled Demonstration;Pictoral       Education Topics: Hypertension, Hypertension Reduction -Define heart disease and high blood pressure. Discus how high blood pressure affects the body and ways to reduce high blood pressure.   Exercise and Your Heart -Discuss why it is important to exercise, the FITT principles of exercise, normal and abnormal responses to exercise, and how to exercise safely.   Angina -Discuss definition of angina, causes of angina, treatment of angina, and how to decrease risk of having angina.   Cardiac Medications -Review what the following cardiac medications are used for, how they affect the body, and side effects that may occur when taking the medications.  Medications include Aspirin, Beta blockers, calcium channel blockers, ACE Inhibitors, angiotensin receptor blockers, diuretics, digoxin, and antihyperlipidemics.   Congestive Heart Failure -Discuss the definition of CHF, how to live with CHF, the signs and symptoms of CHF, and how keep track of weight and sodium intake.   Heart Disease and Intimacy -Discus the effect sexual activity has on the heart, how changes occur during intimacy as  we age, and safety during sexual activity.   Smoking Cessation / COPD -Discuss different methods to quit smoking, the health benefits of quitting smoking, and the definition of COPD.   Nutrition I: Fats -Discuss the types of cholesterol, what cholesterol does to the heart, and how cholesterol levels can be controlled.   Nutrition II: Labels -Discuss the different components of food labels and how to read food label   Heart Parts/Heart Disease and PAD -Discuss the anatomy of the heart, the pathway of blood circulation through the heart, and these are affected by heart disease.   Stress I: Signs and Symptoms -Discuss the causes of stress, how stress may lead to anxiety and depression, and ways to limit stress.   Stress II: Relaxation -Discuss different types of relaxation techniques to limit stress.   Warning Signs of Stroke / TIA -Discuss definition of a stroke, what the signs and symptoms are of a stroke, and how to identify when someone is having stroke.   Knowledge Questionnaire Score: Knowledge Questionnaire Score - 08/09/19 1059      Knowledge Questionnaire Score   Pre Score  23/28       Core Components/Risk Factors/Patient Goals at Admission: Personal Goals and Risk Factors at Admission - 08/09/19 1343      Core Components/Risk Factors/Patient Goals on Admission    Weight Management  Yes;Weight Maintenance    Admit Weight  164 lb 14.5 oz (74.8 kg)    Tobacco Cessation  Yes   Marvis quit 3 years ago but still uses nicotene gum   Expected Outcomes  Short Term: Will demonstrate readiness to quit, by selecting a quit date.;Short Term: Will quit all tobacco product use, adhering to prevention of relapse plan.    Diabetes  Yes   Bartley is a diet controlled diabetic   Intervention  Provide education about signs/symptoms and action to take for hypo/hyperglycemia.;Provide education about proper nutrition, including hydration, and aerobic/resistive exercise prescription  along with prescribed medications to achieve  blood glucose in normal ranges: Fasting glucose 65-99 mg/dL    Lipids  Yes    Intervention  Provide education and support for participant on nutrition & aerobic/resistive exercise along with prescribed medications to achieve LDL '70mg'$ , HDL >'40mg'$ .    Expected Outcomes  Short Term: Participant states understanding of desired cholesterol values and is compliant with medications prescribed. Participant is following exercise prescription and nutrition guidelines.;Long Term: Cholesterol controlled with medications as prescribed, with individualized exercise RX and with personalized nutrition plan. Value goals: LDL < '70mg'$ , HDL > 40 mg.    Intervention  Offer individual and/or small group education and counseling on adjustment to heart disease, stress management and health-related lifestyle change. Teach and support self-help strategies.;Refer participants experiencing significant psychosocial distress to appropriate mental health specialists for further evaluation and treatment. When possible, include family members and significant others in education/counseling sessions.    Expected Outcomes  Short Term: Participant demonstrates changes in health-related behavior, relaxation and other stress management skills, ability to obtain effective social support, and compliance with psychotropic medications if prescribed.;Long Term: Emotional wellbeing is indicated by absence of clinically significant psychosocial distress or social isolation.       Core Components/Risk Factors/Patient Goals Review:  Goals and Risk Factor Review    Row Name 09/06/19 1545             Core Components/Risk Factors/Patient Goals Review   Personal Goals Review  Weight Management/Obesity;Lipids;Diabetes;Tobacco Cessation;Stress       Review  Lynden has been doing well with exercise at cardiac rehab. Gerber's vital signs and CBG's have been stable. Claud continues to use nicotene gum and  does not plan to stop at this time.       Expected Outcomes  Patient will continue to participate in phase 2 cardiac rehab for exercise, nutrition and lifestyle modifications.          Core Components/Risk Factors/Patient Goals at Discharge (Final Review):  Goals and Risk Factor Review - 09/06/19 1545      Core Components/Risk Factors/Patient Goals Review   Personal Goals Review  Weight Management/Obesity;Lipids;Diabetes;Tobacco Cessation;Stress    Review  Val has been doing well with exercise at cardiac rehab. Hanley's vital signs and CBG's have been stable. Nicoli continues to use nicotene gum and does not plan to stop at this time.    Expected Outcomes  Patient will continue to participate in phase 2 cardiac rehab for exercise, nutrition and lifestyle modifications.       ITP Comments: ITP Comments    Row Name 08/09/19 1042 09/06/19 1543         ITP Comments  Dr. Fransico Him, Medical Director  30 Day ITP Review. Patient with good participation and attendance in phase 2 cardiac rehab.         Comments: See ITP comments. Levin has not had any more complaints of shoulder or chest discomfort since his equipment has been adjusted.Barnet Pall, RN,BSN 09/06/2019 3:51 PM

## 2019-09-07 ENCOUNTER — Other Ambulatory Visit: Payer: Self-pay

## 2019-09-07 ENCOUNTER — Encounter (HOSPITAL_COMMUNITY)
Admission: RE | Admit: 2019-09-07 | Discharge: 2019-09-07 | Disposition: A | Discharge status: Home / Self Care | Payer: Medicare HMO | Source: Ambulatory Visit | Attending: Interventional Cardiology | Admitting: Interventional Cardiology

## 2019-09-07 DIAGNOSIS — Z955 Presence of coronary angioplasty implant and graft: Secondary | ICD-10-CM | POA: Diagnosis not present

## 2019-09-07 DIAGNOSIS — I214 Non-ST elevation (NSTEMI) myocardial infarction: Secondary | ICD-10-CM | POA: Diagnosis not present

## 2019-09-12 ENCOUNTER — Other Ambulatory Visit: Payer: Self-pay

## 2019-09-12 ENCOUNTER — Encounter (HOSPITAL_COMMUNITY)
Admission: RE | Admit: 2019-09-12 | Discharge: 2019-09-12 | Disposition: A | Discharge status: Home / Self Care | Payer: Medicare HMO | Source: Ambulatory Visit | Attending: Interventional Cardiology | Admitting: Interventional Cardiology

## 2019-09-12 DIAGNOSIS — I214 Non-ST elevation (NSTEMI) myocardial infarction: Secondary | ICD-10-CM | POA: Diagnosis not present

## 2019-09-12 DIAGNOSIS — Z955 Presence of coronary angioplasty implant and graft: Secondary | ICD-10-CM

## 2019-09-14 ENCOUNTER — Other Ambulatory Visit: Payer: Self-pay

## 2019-09-14 ENCOUNTER — Encounter (HOSPITAL_COMMUNITY)
Admission: RE | Admit: 2019-09-14 | Discharge: 2019-09-14 | Disposition: A | Discharge status: Home / Self Care | Payer: Medicare HMO | Source: Ambulatory Visit | Attending: Interventional Cardiology | Admitting: Interventional Cardiology

## 2019-09-14 DIAGNOSIS — I214 Non-ST elevation (NSTEMI) myocardial infarction: Secondary | ICD-10-CM

## 2019-09-14 DIAGNOSIS — Z955 Presence of coronary angioplasty implant and graft: Secondary | ICD-10-CM

## 2019-09-18 DIAGNOSIS — Z0000 Encounter for general adult medical examination without abnormal findings: Secondary | ICD-10-CM | POA: Diagnosis not present

## 2019-09-18 DIAGNOSIS — J439 Emphysema, unspecified: Secondary | ICD-10-CM | POA: Diagnosis not present

## 2019-09-18 DIAGNOSIS — Z79899 Other long term (current) drug therapy: Secondary | ICD-10-CM | POA: Diagnosis not present

## 2019-09-18 DIAGNOSIS — E1169 Type 2 diabetes mellitus with other specified complication: Secondary | ICD-10-CM | POA: Diagnosis not present

## 2019-09-18 DIAGNOSIS — Z125 Encounter for screening for malignant neoplasm of prostate: Secondary | ICD-10-CM | POA: Diagnosis not present

## 2019-09-18 DIAGNOSIS — I700 Atherosclerosis of aorta: Secondary | ICD-10-CM | POA: Diagnosis not present

## 2019-09-18 DIAGNOSIS — E785 Hyperlipidemia, unspecified: Secondary | ICD-10-CM | POA: Diagnosis not present

## 2019-09-19 ENCOUNTER — Encounter (HOSPITAL_COMMUNITY): Payer: Medicare HMO

## 2019-09-19 ENCOUNTER — Encounter (HOSPITAL_COMMUNITY)
Admission: RE | Admit: 2019-09-19 | Discharge: 2019-09-19 | Disposition: A | Discharge status: Home / Self Care | Payer: Medicare HMO | Source: Ambulatory Visit | Attending: Interventional Cardiology | Admitting: Interventional Cardiology

## 2019-09-19 ENCOUNTER — Other Ambulatory Visit: Payer: Self-pay

## 2019-09-19 VITALS — Ht 71.0 in | Wt 164.2 lb

## 2019-09-19 DIAGNOSIS — I214 Non-ST elevation (NSTEMI) myocardial infarction: Secondary | ICD-10-CM | POA: Diagnosis not present

## 2019-09-19 DIAGNOSIS — Z955 Presence of coronary angioplasty implant and graft: Secondary | ICD-10-CM

## 2019-09-21 ENCOUNTER — Other Ambulatory Visit: Payer: Self-pay

## 2019-09-21 ENCOUNTER — Encounter (HOSPITAL_COMMUNITY): Payer: Medicare HMO

## 2019-09-21 ENCOUNTER — Encounter (HOSPITAL_COMMUNITY)
Admission: RE | Admit: 2019-09-21 | Discharge: 2019-09-21 | Disposition: A | Discharge status: Home / Self Care | Payer: Medicare HMO | Source: Ambulatory Visit | Attending: Cardiovascular Disease | Admitting: Cardiovascular Disease

## 2019-09-21 DIAGNOSIS — Z955 Presence of coronary angioplasty implant and graft: Secondary | ICD-10-CM | POA: Diagnosis not present

## 2019-09-21 DIAGNOSIS — I214 Non-ST elevation (NSTEMI) myocardial infarction: Secondary | ICD-10-CM | POA: Insufficient documentation

## 2019-09-21 NOTE — Progress Notes (Signed)
Discharge Progress Report  Patient Details  Name: Billy Lawson MRN: [id number] Date of Birth: 1946/08/08 Referring Provider:     CARDIAC REHAB PHASE II ORIENTATION from 08/09/2019 in [facility]  Referring Provider  Dr. Tamala Lawson       Number of Visits: 12  Reason for Discharge:  Patient reached a stable level of exercise. Patient independent in their exercise. Patient has met program and personal goals.  Smoking History:  Social History   Tobacco Use  Smoking Status Former Smoker  . Packs/day: 1.45  . Years: 54.00  . Pack years: 78.30  . Types: Cigarettes  . Start date: 12/21/1963  . Quit date: 01/21/2016  . Years since quitting: 3.6  Smokeless Tobacco Never Used  Tobacco Comment   USES NICKORET GUM     Diagnosis:  NSTEMI (non-ST elevated myocardial infarction) ([place]) 02/06/19  Status post coronary artery stent placement 02/07/19  ADL UCSD:   Initial Exercise Prescription: Initial Exercise Prescription - 08/09/19 1200      Date of Initial Exercise RX and Referring Provider   Date  08/09/19    Referring Provider  Dr. Tamala Lawson    Expected Discharge Date  09/14/19      Treadmill   MPH  3    Grade  1    Minutes  15      NuStep   Level  3    SPM  85    Minutes  15    METs  3      Prescription Details   Frequency (times per week)  3    Duration  Progress to 30 minutes of continuous aerobic without signs/symptoms of physical distress      Intensity   THRR 40-80% of Max Heartrate  59-118    Ratings of Perceived Exertion  11-13      Progression   Progression  Continue to progress workloads to maintain intensity without signs/symptoms of physical distress.      Resistance Training   Training Prescription  Yes    Weight  4 lbs.     Reps  10-15       Discharge Exercise Prescription (Final Exercise Prescription Changes): Exercise Prescription Changes - 09/21/19 1500      Response to Exercise   Blood Pressure (Admit)   118/68    Blood Pressure (Exercise)  142/62    Blood Pressure (Exit)  102/60    Heart Rate (Admit)  68 bpm    Heart Rate (Exercise)  83 bpm    Heart Rate (Exit)  67 bpm    Rating of Perceived Exertion (Exercise)  13    Symptoms  None    Comments  Pt graduated CR program today.     Duration  Continue with 30 min of aerobic exercise without signs/symptoms of physical distress.    Intensity  THRR unchanged      Progression   Progression  Continue to progress workloads to maintain intensity without signs/symptoms of physical distress.    Average METs  3.2      Resistance Training   Training Prescription  Yes    Weight  4 lbs.     Reps  10-15    Time  10 Minutes      Interval Training   Interval Training  No      Recumbant Bike   Level  3    Watts  29    Minutes  15    METs  4.4  NuStep   Level  3    SPM  85    Minutes  15    METs  2.1      Home Exercise Plan   Plans to continue exercise at  Home (comment)    Frequency  Add 3 additional days to program exercise sessions.    Initial Home Exercises Provided  08/31/19       Functional Capacity: 6 Minute Walk    Row Name 08/09/19 1207 09/19/19 1601       6 Minute Walk   Phase  Initial  Discharge    Distance  1610 feet  1907 feet    Distance % Change  -  18.45 %    Distance Feet Change  -  297 ft    Walk Time  6 minutes  6 minutes    # of Rest Breaks  0  0    MPH  3  3.6    METS  3.5  4    RPE  12  12    Perceived Dyspnea   0  0    VO2 [facility]  12.43  14.33    Symptoms  No  Yes (comment)    Comments  -  Side and arm discomfort    Resting HR  72 bpm  65 bpm    Resting BP  118/60  110/60    Resting Oxygen Saturation   99 %  -    Exercise Oxygen Saturation  during 6 min walk  99 %  -    Max Ex. HR  88 bpm  96 bpm    Max Ex. BP  128/70  120/58    2 Minute Post BP  116/64  96/64       Psychological, QOL, Others - Outcomes: PHQ 2/9: Depression screen [facility] 2/9 09/21/2019 08/09/2019  Decreased Interest 0 0   Down, Depressed, Hopeless 1 0  PHQ - 2 Score 1 0    Quality of Life: Quality of Life - 09/19/19 1604      Quality of Life Scores   Health/Function Post  25.47 %    Socioeconomic Post  28.33 %    Psych/Spiritual Post  23.57 %    Family Post  29.5 %    GLOBAL Post  26.2 %       Personal Goals: Goals established at orientation with interventions provided to work toward goal. Personal Goals and Risk Factors at Admission - 08/09/19 1343      Core Components/Risk Factors/Patient Goals on Admission    Weight Management  Yes;Weight Maintenance    Admit Weight  164 lb 14.5 oz (74.8 kg)    Tobacco Cessation  Yes   [name] quit 3 years ago but still uses nicotene gum   Expected Outcomes  Short Term: Will demonstrate readiness to quit, by selecting a quit date.;Short Term: Will quit all tobacco product use, adhering to prevention of relapse plan.    Diabetes  Yes   [name] is a diet controlled diabetic   Intervention  Provide education about signs/symptoms and action to take for hypo/hyperglycemia.;Provide education about proper nutrition, including hydration, and aerobic/resistive exercise prescription along with prescribed medications to achieve blood glucose in normal ranges: Fasting glucose 65-99 mg/dL    Lipids  Yes    Intervention  Provide education and support for participant on nutrition & aerobic/resistive exercise along with prescribed medications to achieve LDL <40m, HDL >463m    Expected Outcomes  Short Term: Participant states  understanding of desired cholesterol values and is compliant with medications prescribed. Participant is following exercise prescription and nutrition guidelines.;Long Term: Cholesterol controlled with medications as prescribed, with individualized exercise RX and with personalized nutrition plan. Value goals: LDL < 23m, HDL > 40 mg.    Intervention  Offer individual and/or small group education and counseling on adjustment to heart disease, stress  management and health-related lifestyle change. Teach and support self-help strategies.;Refer participants experiencing significant psychosocial distress to appropriate mental health specialists for further evaluation and treatment. When possible, include family members and significant others in education/counseling sessions.    Expected Outcomes  Short Term: Participant demonstrates changes in health-related behavior, relaxation and other stress management skills, ability to obtain effective social support, and compliance with psychotropic medications if prescribed.;Long Term: Emotional wellbeing is indicated by absence of clinically significant psychosocial distress or social isolation.        Personal Goals Discharge: Goals and Risk Factor Review    Row Name 09/06/19 1545 09/21/19 1632           Core Components/Risk Factors/Patient Goals Review   Personal Goals Review  Weight Management/Obesity;Lipids;Diabetes;Tobacco Cessation;Stress  Weight Management/Obesity;Lipids;Diabetes;Tobacco Cessation;Stress      Review  MKishawnhas been doing well with exercise at cardiac rehab. Quoc's vital signs and CBG's have been stable. MMennocontinues to use nicotene gum and does not plan to stop at this time.  Mr. VBenay Spicecompleted his 12th and final session of Cardiac Rehab today. Patient feels he has now established a good exercise routein/habit and plans to continue to exercise 3 times a week in a community gym. His continues to have good control over his blood sugars. He continues to use nicotene gum and is not ready to discontinue. He has been cautioned about continued use.      Expected Outcomes  Patient will continue to participate in phase 2 cardiac rehab for exercise, nutrition and lifestyle modifications.  Patient will continue to exercise and utilize lifestyle modifications he has learned in CR to decreased his Cardiac risk factors.         Exercise Goals and Review: Exercise Goals    Row  Name 08/09/19 1212             Exercise Goals   Increase Physical Activity  Yes       Intervention  Provide advice, education, support and counseling about physical activity/exercise needs.;Develop an individualized exercise prescription for aerobic and resistive training based on initial evaluation findings, risk stratification, comorbidities and participant's personal goals.       Expected Outcomes  Short Term: Attend rehab on a regular basis to increase amount of physical activity.;Long Term: Add in home exercise to make exercise part of routine and to increase amount of physical activity.;Long Term: Exercising regularly at least 3-5 days a week.       Increase Strength and Stamina  Yes       Intervention  Provide advice, education, support and counseling about physical activity/exercise needs.;Develop an individualized exercise prescription for aerobic and resistive training based on initial evaluation findings, risk stratification, comorbidities and participant's personal goals.       Expected Outcomes  Short Term: Increase workloads from initial exercise prescription for resistance, speed, and METs.;Short Term: Perform resistance training exercises routinely during rehab and add in resistance training at home;Long Term: Improve cardiorespiratory fitness, muscular endurance and strength as measured by increased METs and functional capacity (6MWT)       Able to understand and use  rate of perceived exertion (RPE) scale  Yes       Intervention  Provide education and explanation on how to use RPE scale       Expected Outcomes  Short Term: Able to use RPE daily in rehab to express subjective intensity level;Long Term:  Able to use RPE to guide intensity level when exercising independently       Knowledge and understanding of Target Heart Rate Range (THRR)  Yes       Intervention  Provide education and explanation of THRR including how the numbers were predicted and where they are located for  reference       Expected Outcomes  Short Term: Able to state/look up THRR;Long Term: Able to use THRR to govern intensity when exercising independently;Short Term: Able to use daily as guideline for intensity in rehab       Able to check pulse independently  Yes       Intervention  Provide education and demonstration on how to check pulse in carotid and radial arteries.;Review the importance of being able to check your own pulse for safety during independent exercise       Expected Outcomes  Short Term: Able to explain why pulse checking is important during independent exercise;Long Term: Able to check pulse independently and accurately       Understanding of Exercise Prescription  Yes       Intervention  Provide education, explanation, and written materials on patient's individual exercise prescription       Expected Outcomes  Short Term: Able to explain program exercise prescription;Long Term: Able to explain home exercise prescription to exercise independently          Exercise Goals Re-Evaluation: Exercise Goals Re-Evaluation    Row Name 08/15/19 1505 08/22/19 1453 08/31/19 1608 09/06/19 1446 09/21/19 1505     Exercise Goal Re-Evaluation   Exercise Goals Review  Increase Physical Activity;Understanding of Exercise Prescription;Increase Strength and Stamina;Knowledge and understanding of Target Heart Rate Range (THRR);Able to understand and use rate of perceived exertion (RPE) scale  Increase Physical Activity;Increase Strength and Stamina;Able to understand and use rate of perceived exertion (RPE) scale;Knowledge and understanding of Target Heart Rate Range (THRR);Understanding of Exercise Prescription  Increase Physical Activity;Increase Strength and Stamina;Able to understand and use rate of perceived exertion (RPE) scale;Knowledge and understanding of Target Heart Rate Range (THRR);Able to check pulse independently;Understanding of Exercise Prescription  Increase Physical Activity;Increase  Strength and Stamina;Able to understand and use rate of perceived exertion (RPE) scale;Knowledge and understanding of Target Heart Rate Range (THRR);Able to check pulse independently;Understanding of Exercise Prescription  Increase Physical Activity;Increase Strength and Stamina;Able to understand and use rate of perceived exertion (RPE) scale;Knowledge and understanding of Target Heart Rate Range (THRR);Able to check pulse independently;Understanding of Exercise Prescription   Comments  Pt first day of exercise in CR program. Pt experienced bilateral underarm discomfort, 2/10 while walking on the treadmill for 8 minutes at 3.0 mph. Pt stopped exercising and nurse was notified. Nurse ordered 12-lead EKG. Will follow up with Pt.  Pt first full day of exercise in CR program. Pt tolerated exercise Rx well. Pt understands THRR, RPE scale, and exercise Rx.  Reviewed HEP with Pt. Pt understands exercise Rx, RPE sclae, THRR, end points of exercise, weather precautions, and NTG use. Pt stated he is not currently exercising at home but was going to start walking 5-7 days per week for 30-45 minutes a day. he also stated he would like to go back  to the gym.  Pt is tolerating exercise Rx well and is progressing well in CR program. Pt is increasing workloads and MET level with each session. MET level is 2.9. Pt was encouraged to start exercising at home in addition to CR program. Pt states he is planning to go back to the gym to start exercising again.  Pt graduated CR program today. Pt tolerated exercise Rx well and progressed through the program well. Pt MET level reached a 3.2 and Pt increased workloads gradually. Pt stated he plans to return to the gym where he will use the treadmill, rec bike, and weights 5-7 days per week for 30-45 minutes a day. Pt stated he now understands the importance of physical activity for heart health.   Expected Outcomes  Will continue to monitor and progress Pt as tolerated.  Will continue to  monitor and progress Pt as tolerated.  Will continue to monitor and progress Pt as tolerated.  Will continue to monitor and progress Pt as tolerated.  Pt will continue to exercise at a gym.      Nutrition & Weight - Outcomes: Pre Biometrics - 08/09/19 1031      Pre Biometrics   Height  5' 11" (1.803 m)    Waist Circumference  34 inches    Hip Circumference  40.5 inches    Waist to Hip Ratio  0.84 %    BMI (Calculated)  23.01    Triceps Skinfold  19 mm    % Body Fat  23.9 %    Grip Strength  42 kg    Flexibility  0 in    Single Leg Stand  30 seconds      Post Biometrics - 09/19/19 1602       Post  Biometrics   Height  5' 11" (1.803 m)    Weight  74.5 kg    Waist Circumference  34 inches    Hip Circumference  40 inches    Waist to Hip Ratio  0.85 %    BMI (Calculated)  22.92    Triceps Skinfold  19 mm    % Body Fat  23.9 %    Grip Strength  43 kg    Flexibility  0 in    Single Leg Stand  35 seconds       Nutrition:   Nutrition Discharge:   Education Questionnaire Score: Knowledge Questionnaire Score - 09/19/19 1604      Knowledge Questionnaire Score   Post Score  21/24       Goals reviewed with patient; copy given to patient. Pt graduated from cardiac rehab program today with completion of 12 exercise sessions in Phase II. Pt maintained good attendance and progressed nicely during his participation in rehab as evidenced by increased MET level. Medication list reconciled. Repeat PHQ score-1. This is an increase from 0 at the beginning of the program. Patient states his slight depression is related to the "current political climate, Covid-19, and state of the world". He acknowledges a very strong support system.  Pt has made lifestyle changes and should be commended for his success. Pt feels he has achieved his goals during cardiac rehab.

## 2019-10-11 DIAGNOSIS — R69 Illness, unspecified: Secondary | ICD-10-CM | POA: Diagnosis not present

## 2019-10-19 DIAGNOSIS — R69 Illness, unspecified: Secondary | ICD-10-CM | POA: Diagnosis not present

## 2019-11-26 DIAGNOSIS — Z20828 Contact with and (suspected) exposure to other viral communicable diseases: Secondary | ICD-10-CM | POA: Diagnosis not present

## 2019-11-27 DIAGNOSIS — Z20828 Contact with and (suspected) exposure to other viral communicable diseases: Secondary | ICD-10-CM | POA: Diagnosis not present

## 2019-11-28 ENCOUNTER — Ambulatory Visit: Payer: Medicare HMO | Admitting: Interventional Cardiology

## 2019-12-07 DIAGNOSIS — J439 Emphysema, unspecified: Secondary | ICD-10-CM | POA: Diagnosis not present

## 2019-12-07 DIAGNOSIS — R69 Illness, unspecified: Secondary | ICD-10-CM | POA: Diagnosis not present

## 2019-12-07 DIAGNOSIS — Z20828 Contact with and (suspected) exposure to other viral communicable diseases: Secondary | ICD-10-CM | POA: Diagnosis not present

## 2019-12-07 DIAGNOSIS — Z7902 Long term (current) use of antithrombotics/antiplatelets: Secondary | ICD-10-CM | POA: Diagnosis not present

## 2019-12-07 DIAGNOSIS — E119 Type 2 diabetes mellitus without complications: Secondary | ICD-10-CM | POA: Diagnosis not present

## 2019-12-07 DIAGNOSIS — I2510 Atherosclerotic heart disease of native coronary artery without angina pectoris: Secondary | ICD-10-CM | POA: Diagnosis not present

## 2019-12-07 DIAGNOSIS — I252 Old myocardial infarction: Secondary | ICD-10-CM | POA: Diagnosis not present

## 2019-12-07 DIAGNOSIS — N529 Male erectile dysfunction, unspecified: Secondary | ICD-10-CM | POA: Diagnosis not present

## 2019-12-07 DIAGNOSIS — I10 Essential (primary) hypertension: Secondary | ICD-10-CM | POA: Diagnosis not present

## 2019-12-07 DIAGNOSIS — E785 Hyperlipidemia, unspecified: Secondary | ICD-10-CM | POA: Diagnosis not present

## 2019-12-11 NOTE — Progress Notes (Signed)
Cardiology Office Note:    Date:  12/12/2019   ID:  Billy Lawson, DOB 1946/06/10, MRN [id number]  PCP:  Lawerance Cruel, MD  Cardiologist:  Sinclair Grooms, MD   Referring MD: Lawerance Cruel, MD   Chief Complaint  Patient presents with  . Coronary Artery Disease    History of Present Illness:    Billy Lawson is a 73 y.o. male with a hx of  NSTEMI 02/07/19 DES to LAD , hyperlipidemia, and DM 2.   He is doing well.  He notices increased bleeding with cuts and scrapes.  He is not having chest pain.  He wants medication reduction if possible.  No medication side effects.  Not exercising.  Denies tobacco use.  He denies claudication.  He has not had neurological complaints.  We spent significant time discussing the protective effects of his current medical therapy which includes clopidogrel, high intensity statin therapy, beta-blocker, and angiotensin receptor blocker therapy.  Past Medical History:  Diagnosis Date  . Allergic rhinitis   . COPD (chronic obstructive pulmonary disease) ([place])   . Coronary artery disease   . Diabetes mellitus without complication ([place])   . Hyperlipidemia   . Pneumonia    once in his 48s & was hospitalized without intubation    Past Surgical History:  Procedure Laterality Date  . [place]  . CARDIAC CATHETERIZATION    . CORONARY STENT INTERVENTION  02/07/2019  . CORONARY STENT INTERVENTION N/A 02/07/2019   Procedure: CORONARY STENT INTERVENTION;  Surgeon: Wellington Hampshire, MD;  Location: [facility];  Service: Cardiovascular;  Laterality: N/A;  . INTRAVASCULAR ULTRASOUND/IVUS N/A 02/07/2019   Procedure: Intravascular Ultrasound/IVUS;  Surgeon: Wellington Hampshire, MD;  Location: [facility];  Service: Cardiovascular;  Laterality: N/A;  . LEFT HEART CATH AND CORONARY ANGIOGRAPHY N/A 02/07/2019   Procedure: LEFT HEART CATH AND CORONARY ANGIOGRAPHY;  Surgeon: Wellington Hampshire, MD;  Location:  [facility];  Service: Cardiovascular;  Laterality: N/A;    Current Medications: Current Meds  Medication Sig  . aspirin EC 81 MG EC tablet Take 1 tablet (81 mg total) by mouth daily.  Marland Kitchen atorvastatin (LIPITOR) 80 MG tablet Take 1 tablet (80 mg total) by mouth daily at 6 PM.  . carvedilol (COREG) 3.125 MG tablet Take 1 tablet (3.125 mg total) by mouth 2 (two) times daily with a meal.  . clopidogrel (PLAVIX) 75 MG tablet Take 1 tablet (75 mg total) by mouth daily.  Marland Kitchen losartan (COZAAR) 25 MG tablet Take 0.5 tablets (12.5 mg total) by mouth daily.  . nitroGLYCERIN (NITROSTAT) 0.4 MG SL tablet Place 1 tablet (0.4 mg total) under the tongue every 5 (five) minutes x 3 doses as needed for chest pain.     Allergies:   Patient has no known allergies.   Social History   Socioeconomic History  . Marital status: Married    Spouse name: Not on file  . Number of children: Not on file  . Years of education: 29  . Highest education level: Bachelor's degree (e.g., BA, AB, BS)  Occupational History  . Occupation: Freight forwarder    Comment: [employer]ologist shop  Tobacco Use  . Smoking status: Former Smoker    Packs/day: 1.45    Years: 54.00    Pack years: 78.30    Types: Cigarettes    Start date: 12/21/1963    Quit date: 01/21/2016    Years since  quitting: 3.8  . Smokeless tobacco: Never Used  . Tobacco comment: USES NICKORET GUM   Substance and Sexual Activity  . Alcohol use: No    Alcohol/week: 0.0 standard drinks    Comment: Recovering alcoholic   . Drug use: No    Comment: Remote marijuana  . Sexual activity: Not on file  Other Topics Concern  . Not on file  Social History Narrative   Originally from [place], [place]. Previously has lived in [place]. Moved to [place] in 1965. Previously worked in Advertising copywriter business. Enjoys reading and playing music - guitar & piano. No pets currently. No bird, mold, or hot tub exposure.    Social Determinants of Health   Financial Resource Strain:  Low Risk   . Difficulty of Paying Living Expenses: Not hard at all  Food Insecurity: No Food Insecurity  . Worried About Charity fundraiser in the Last Year: Never true  . Ran Out of Food in the Last Year: Never true  Transportation Needs: No Transportation Needs  . Lack of Transportation (Medical): No  . Lack of Transportation (Non-Medical): No  Physical Activity: Inactive  . Days of Exercise per Week: 0 days  . Minutes of Exercise per Session: 0 min  Stress: Stress Concern Present  . Feeling of Stress : To some extent  Social Connections:   . Frequency of Communication with Friends and Family: Not on file  . Frequency of Social Gatherings with Friends and Family: Not on file  . Attends Religious Services: Not on file  . Active Member of Clubs or Organizations: Not on file  . Attends Archivist Meetings: Not on file  . Marital Status: Not on file     Family History: The patient's family history includes Aortic aneurysm in his paternal grandfather; Cancer in his brother; Diabetes in his mother and paternal grandmother; Hypertension in his mother; Lymphoma in his father. There is no history of Lung disease.  ROS:   Please see the history of present illness.    Easy bruising and bleeding all other systems reviewed and are negative.  EKGs/Labs/Other Studies Reviewed:    The following studies were reviewed today: No new data.  Last EF 45 to 50%  EKG:  EKG not repeated  Recent Labs: 02/06/2019: TSH 2.205 02/08/2019: Hemoglobin 12.9; Platelets 156 02/19/2019: BUN 20; Creatinine, Ser 1.15; Potassium 4.2; Sodium 138 06/11/2019: ALT 27  Recent Lipid Panel    Component Value Date/Time   CHOL 114 08/14/2019 1032   TRIG 87 08/14/2019 1032   HDL 36 (L) 08/14/2019 1032   CHOLHDL 3.2 08/14/2019 1032   CHOLHDL 3.9 02/07/2019 0438   VLDL 8 02/07/2019 0438   LDLCALC 61 08/14/2019 1032    Physical Exam:    VS:  BP 126/64   Ht 5\' 11"  (1.803 m)   Wt 168 lb 1.9 oz (76.3 kg)    BMI 23.45 kg/m     Wt Readings from Last 3 Encounters:  12/12/19 168 lb 1.9 oz (76.3 kg)  09/19/19 164 lb 3.9 oz (74.5 kg)  08/29/19 163 lb (73.9 kg)     GEN: Slender. No acute distress HEENT: Normal NECK: No JVD. LYMPHATICS: No lymphadenopathy CARDIAC:  RRR without murmur, gallop, or edema. VASCULAR:  Normal Pulses. No bruits. RESPIRATORY:  Clear to auscultation without rales, wheezing or rhonchi  ABDOMEN: Soft, non-tender, non-distended, No pulsatile mass, MUSCULOSKELETAL: No deformity  SKIN: Warm and dry NEUROLOGIC:  Alert and oriented x 3 PSYCHIATRIC:  Normal affect  ASSESSMENT:    1. Coronary artery disease involving native coronary artery of native heart without angina pectoris   2. Hyperlipidemia LDL goal <70   3. Essential hypertension   4. Controlled type 2 diabetes mellitus with other circulatory complication, without long-term current use of insulin ([place])   5. Educated about COVID-19 virus infection    PLAN:    In order of problems listed above:  1. Secondary prevention discussed in detail.  Most important in his case is 150 minutes of moderate activity per week, LDL less than 70, and hemoglobin A1c less than 7.  Consider SGLT2 therapy.  We will go to monotherapy with antiplatelet agents discontinuing aspirin in February. 2. Most recent LDL was 71.  He is on max intensity atorvastatin. 3. Blood pressure is excellently controlled. 4. A1c should be less than 7.  Consider SGLT2 therapy. 5. Social distancing, mask wearing, and handwashing is stressed to avoid COVID-19 infection.  Overall education and awareness concerning primary/secondary risk prevention was discussed in detail: LDL less than 70, hemoglobin A1c less than 7, blood pressure target less than 130/80 mmHg, >150 minutes of moderate aerobic activity per week, avoidance of smoking, weight control (via diet and exercise), and continued surveillance/management of/for obstructive sleep apnea.    Medication  Adjustments/Labs and Tests Ordered: Current medicines are reviewed at length with the patient today.  Concerns regarding medicines are outlined above.  No orders of the defined types were placed in this encounter.  No orders of the defined types were placed in this encounter.   There are no Patient Instructions on file for this visit.   Signed, Sinclair Grooms, MD  12/12/2019 8:06 AM    [place]

## 2019-12-12 ENCOUNTER — Ambulatory Visit: Payer: Medicare HMO | Admitting: Interventional Cardiology

## 2019-12-12 ENCOUNTER — Encounter: Payer: Self-pay | Admitting: Interventional Cardiology

## 2019-12-12 ENCOUNTER — Other Ambulatory Visit: Payer: Self-pay

## 2019-12-12 VITALS — BP 126/64 | Ht 71.0 in | Wt 168.1 lb

## 2019-12-12 DIAGNOSIS — E785 Hyperlipidemia, unspecified: Secondary | ICD-10-CM

## 2019-12-12 DIAGNOSIS — I10 Essential (primary) hypertension: Secondary | ICD-10-CM | POA: Diagnosis not present

## 2019-12-12 DIAGNOSIS — Z7189 Other specified counseling: Secondary | ICD-10-CM | POA: Diagnosis not present

## 2019-12-12 DIAGNOSIS — I2510 Atherosclerotic heart disease of native coronary artery without angina pectoris: Secondary | ICD-10-CM | POA: Diagnosis not present

## 2019-12-12 DIAGNOSIS — E1159 Type 2 diabetes mellitus with other circulatory complications: Secondary | ICD-10-CM | POA: Diagnosis not present

## 2019-12-12 NOTE — Patient Instructions (Addendum)
Medication Instructions:  1) DISCONTINUE Aspirin in February  *If you need a refill on your cardiac medications before your next appointment, please call your pharmacy*  Lab Work: None If you have labs (blood work) drawn today and your tests are completely normal, you will receive your results only by: Marland Kitchen MyChart Message (if you have MyChart) OR . A paper copy in the mail If you have any lab test that is abnormal or we need to change your treatment, we will call you to review the results.  Testing/Procedures: None  Follow-Up: At [facility], you and your health needs are our priority.  As part of our continuing mission to provide you with exceptional heart care, we have created designated Provider Care Teams.  These Care Teams include your primary Cardiologist (physician) and Advanced Practice Providers (APPs -  Physician Assistants and Nurse Practitioners) who all work together to provide you with the care you need, when you need it.  Your next appointment:   12 month(s)  The format for your next appointment:   In Person  Provider:   You may see Sinclair Grooms, MD or one of the following Advanced Practice Providers on your designated Care Team:    Truitt Merle, NP  Cecilie Kicks, NP  Kathyrn Drown, NP   Other Instructions  Your provider recommends that you maintain 150 minutes per week of moderate aerobic activity.

## 2020-01-09 DIAGNOSIS — Z23 Encounter for immunization: Secondary | ICD-10-CM | POA: Diagnosis not present

## 2020-01-22 ENCOUNTER — Ambulatory Visit: Payer: Medicare HMO | Admitting: Interventional Cardiology

## 2020-01-24 DIAGNOSIS — I2510 Atherosclerotic heart disease of native coronary artery without angina pectoris: Secondary | ICD-10-CM | POA: Diagnosis not present

## 2020-01-24 DIAGNOSIS — I213 ST elevation (STEMI) myocardial infarction of unspecified site: Secondary | ICD-10-CM | POA: Diagnosis not present

## 2020-01-24 DIAGNOSIS — E1169 Type 2 diabetes mellitus with other specified complication: Secondary | ICD-10-CM | POA: Diagnosis not present

## 2020-01-24 DIAGNOSIS — E785 Hyperlipidemia, unspecified: Secondary | ICD-10-CM | POA: Diagnosis not present

## 2020-01-24 DIAGNOSIS — J439 Emphysema, unspecified: Secondary | ICD-10-CM | POA: Diagnosis not present

## 2020-01-28 ENCOUNTER — Ambulatory Visit: Payer: Medicare HMO

## 2020-01-30 DIAGNOSIS — Z23 Encounter for immunization: Secondary | ICD-10-CM | POA: Diagnosis not present

## 2020-02-03 ENCOUNTER — Ambulatory Visit: Payer: Medicare HMO

## 2020-02-04 ENCOUNTER — Other Ambulatory Visit: Payer: Self-pay | Admitting: Cardiology

## 2020-02-12 ENCOUNTER — Ambulatory Visit: Payer: Medicare HMO | Admitting: Interventional Cardiology

## 2020-02-14 DIAGNOSIS — R69 Illness, unspecified: Secondary | ICD-10-CM | POA: Diagnosis not present

## 2020-03-10 DIAGNOSIS — R69 Illness, unspecified: Secondary | ICD-10-CM | POA: Diagnosis not present

## 2020-03-15 ENCOUNTER — Other Ambulatory Visit: Payer: Self-pay | Admitting: Interventional Cardiology

## 2020-03-18 DIAGNOSIS — E119 Type 2 diabetes mellitus without complications: Secondary | ICD-10-CM | POA: Diagnosis not present

## 2020-03-18 DIAGNOSIS — H33191 Other retinoschisis and retinal cysts, right eye: Secondary | ICD-10-CM | POA: Diagnosis not present

## 2020-03-18 DIAGNOSIS — H11131 Conjunctival pigmentations, right eye: Secondary | ICD-10-CM | POA: Diagnosis not present

## 2020-03-18 DIAGNOSIS — H2513 Age-related nuclear cataract, bilateral: Secondary | ICD-10-CM | POA: Diagnosis not present

## 2020-05-26 ENCOUNTER — Other Ambulatory Visit: Payer: Self-pay

## 2020-05-26 ENCOUNTER — Ambulatory Visit (INDEPENDENT_AMBULATORY_CARE_PROVIDER_SITE_OTHER)
Admission: RE | Admit: 2020-05-26 | Discharge: 2020-05-26 | Disposition: A | Discharge status: Home / Self Care | Payer: Medicare HMO | Source: Ambulatory Visit | Attending: Acute Care | Admitting: Acute Care

## 2020-05-26 DIAGNOSIS — Z122 Encounter for screening for malignant neoplasm of respiratory organs: Secondary | ICD-10-CM

## 2020-05-26 DIAGNOSIS — Z87891 Personal history of nicotine dependence: Secondary | ICD-10-CM

## 2020-05-29 ENCOUNTER — Other Ambulatory Visit: Payer: Self-pay | Admitting: *Deleted

## 2020-05-29 DIAGNOSIS — Z87891 Personal history of nicotine dependence: Secondary | ICD-10-CM

## 2020-05-29 NOTE — Progress Notes (Signed)

## 2020-06-27 ENCOUNTER — Other Ambulatory Visit: Payer: Self-pay | Admitting: Cardiology

## 2020-07-03 DIAGNOSIS — M545 Low back pain: Secondary | ICD-10-CM | POA: Diagnosis not present

## 2020-07-21 DIAGNOSIS — I213 ST elevation (STEMI) myocardial infarction of unspecified site: Secondary | ICD-10-CM | POA: Diagnosis not present

## 2020-07-21 DIAGNOSIS — E1169 Type 2 diabetes mellitus with other specified complication: Secondary | ICD-10-CM | POA: Diagnosis not present

## 2020-07-21 DIAGNOSIS — E785 Hyperlipidemia, unspecified: Secondary | ICD-10-CM | POA: Diagnosis not present

## 2020-07-21 DIAGNOSIS — J439 Emphysema, unspecified: Secondary | ICD-10-CM | POA: Diagnosis not present

## 2020-07-21 DIAGNOSIS — I2510 Atherosclerotic heart disease of native coronary artery without angina pectoris: Secondary | ICD-10-CM | POA: Diagnosis not present

## 2020-07-22 DIAGNOSIS — M545 Low back pain: Secondary | ICD-10-CM | POA: Diagnosis not present

## 2020-08-11 DIAGNOSIS — Z125 Encounter for screening for malignant neoplasm of prostate: Secondary | ICD-10-CM | POA: Diagnosis not present

## 2020-08-11 DIAGNOSIS — E785 Hyperlipidemia, unspecified: Secondary | ICD-10-CM | POA: Diagnosis not present

## 2020-08-11 DIAGNOSIS — I700 Atherosclerosis of aorta: Secondary | ICD-10-CM | POA: Diagnosis not present

## 2020-08-11 DIAGNOSIS — Z20828 Contact with and (suspected) exposure to other viral communicable diseases: Secondary | ICD-10-CM | POA: Diagnosis not present

## 2020-08-11 DIAGNOSIS — J439 Emphysema, unspecified: Secondary | ICD-10-CM | POA: Diagnosis not present

## 2020-08-11 DIAGNOSIS — Z0000 Encounter for general adult medical examination without abnormal findings: Secondary | ICD-10-CM | POA: Diagnosis not present

## 2020-08-11 DIAGNOSIS — Z79899 Other long term (current) drug therapy: Secondary | ICD-10-CM | POA: Diagnosis not present

## 2020-08-11 DIAGNOSIS — E1169 Type 2 diabetes mellitus with other specified complication: Secondary | ICD-10-CM | POA: Diagnosis not present

## 2020-08-14 DIAGNOSIS — E785 Hyperlipidemia, unspecified: Secondary | ICD-10-CM | POA: Diagnosis not present

## 2020-08-14 DIAGNOSIS — J439 Emphysema, unspecified: Secondary | ICD-10-CM | POA: Diagnosis not present

## 2020-08-14 DIAGNOSIS — D485 Neoplasm of uncertain behavior of skin: Secondary | ICD-10-CM | POA: Diagnosis not present

## 2020-08-14 DIAGNOSIS — E1169 Type 2 diabetes mellitus with other specified complication: Secondary | ICD-10-CM | POA: Diagnosis not present

## 2020-09-01 DIAGNOSIS — R69 Illness, unspecified: Secondary | ICD-10-CM | POA: Diagnosis not present

## 2020-09-12 ENCOUNTER — Other Ambulatory Visit: Payer: Self-pay | Admitting: Cardiology

## 2020-09-18 DIAGNOSIS — M545 Low back pain: Secondary | ICD-10-CM | POA: Diagnosis not present

## 2020-09-29 DIAGNOSIS — R69 Illness, unspecified: Secondary | ICD-10-CM | POA: Diagnosis not present

## 2020-10-01 DIAGNOSIS — R69 Illness, unspecified: Secondary | ICD-10-CM | POA: Diagnosis not present

## 2020-10-20 DIAGNOSIS — R69 Illness, unspecified: Secondary | ICD-10-CM | POA: Diagnosis not present

## 2020-10-24 DIAGNOSIS — D485 Neoplasm of uncertain behavior of skin: Secondary | ICD-10-CM | POA: Diagnosis not present

## 2020-10-24 DIAGNOSIS — E785 Hyperlipidemia, unspecified: Secondary | ICD-10-CM | POA: Diagnosis not present

## 2020-10-24 DIAGNOSIS — E1169 Type 2 diabetes mellitus with other specified complication: Secondary | ICD-10-CM | POA: Diagnosis not present

## 2020-10-24 DIAGNOSIS — J439 Emphysema, unspecified: Secondary | ICD-10-CM | POA: Diagnosis not present

## 2020-10-27 ENCOUNTER — Other Ambulatory Visit: Payer: Self-pay | Admitting: Family Medicine

## 2020-10-27 DIAGNOSIS — Z79899 Other long term (current) drug therapy: Secondary | ICD-10-CM | POA: Diagnosis not present

## 2020-10-27 DIAGNOSIS — E785 Hyperlipidemia, unspecified: Secondary | ICD-10-CM | POA: Diagnosis not present

## 2020-10-27 DIAGNOSIS — Z125 Encounter for screening for malignant neoplasm of prostate: Secondary | ICD-10-CM | POA: Diagnosis not present

## 2020-10-27 DIAGNOSIS — E1169 Type 2 diabetes mellitus with other specified complication: Secondary | ICD-10-CM | POA: Diagnosis not present

## 2020-10-27 DIAGNOSIS — Z23 Encounter for immunization: Secondary | ICD-10-CM | POA: Diagnosis not present

## 2020-10-27 DIAGNOSIS — L821 Other seborrheic keratosis: Secondary | ICD-10-CM | POA: Diagnosis not present

## 2020-10-27 DIAGNOSIS — Z1159 Encounter for screening for other viral diseases: Secondary | ICD-10-CM | POA: Diagnosis not present

## 2020-11-04 ENCOUNTER — Telehealth: Payer: Self-pay | Admitting: Interventional Cardiology

## 2020-11-04 NOTE — Telephone Encounter (Signed)
Pt states he has noticed his energy level dropping more and more over time.  Has been on cardiac meds for months.  Most recent medication change was PCP added Jardiance 25mg  QD about 3 months ago.  A1C was 7.3 then and is now down to 7.1.  Doesn't check vitals at home but states the nurse at PCP checked it recently when he was there and made a comment that it was low.  He states he believes it was around 105/50.  Denies syncope, pre syncope or dizziness.  States he does have lightheadedness with position changes that resolves quickly.  Current medication that affect BP are:  Carvedilol 3.125 BID Losartan 12.5mg  QD Jardiance 25mg  QD  Will route to Dr. Tamala Julian for review.

## 2020-11-04 NOTE — Telephone Encounter (Signed)
Wife of the patient wanted to have the patient seen by Dr. Tamala Julian asap. The patient has been started on a lot of new medications for both cardiac and diabetes and he just has no energy. The patient saw his PCP didn't give a definitive answer to explain the recent symptoms he is having.

## 2020-11-05 NOTE — Telephone Encounter (Signed)
Not sure cardiac related. Has the PCP checked blood work, especially Hgb, BMET, TSH? Is he having SOB, orthopnea, Chest pain, or swelling?

## 2020-11-06 NOTE — Telephone Encounter (Signed)
Left message to call back  

## 2020-11-06 NOTE — Telephone Encounter (Signed)
Patient is returning call.  °

## 2020-11-06 NOTE — Telephone Encounter (Signed)
Spoke with pt and he denies SOB, orthopnea, CP or swelling.  Advised to reach out to PCP about getting labs checked.  Pt states he had labs drawn in August and everything was fine. Pt states he will monitor and if no improvement he will reach out to PCP.  Pt appreciative for call.

## 2020-12-02 DIAGNOSIS — J439 Emphysema, unspecified: Secondary | ICD-10-CM | POA: Diagnosis not present

## 2020-12-02 DIAGNOSIS — I2510 Atherosclerotic heart disease of native coronary artery without angina pectoris: Secondary | ICD-10-CM | POA: Diagnosis not present

## 2020-12-02 DIAGNOSIS — E785 Hyperlipidemia, unspecified: Secondary | ICD-10-CM | POA: Diagnosis not present

## 2020-12-02 DIAGNOSIS — I213 ST elevation (STEMI) myocardial infarction of unspecified site: Secondary | ICD-10-CM | POA: Diagnosis not present

## 2020-12-10 DIAGNOSIS — R69 Illness, unspecified: Secondary | ICD-10-CM | POA: Diagnosis not present

## 2020-12-10 NOTE — Progress Notes (Deleted)
CARDIOLOGY OFFICE NOTE  Date:  12/10/2020    Billy Lawson Date of Birth: 29-Jul-1946 Medical Record [id number]  PCP:  Lawerance Cruel, MD  Cardiologist:  Zack Seal chief complaint on file.   History of Present Illness: Billy Lawson is a 74 y.o. male who presents today for a follow up visit. Seen for Dr. Tamala Julian.   He has a history of known CAD with prior NSTEMI in February of 2020 with DES to the LAD, HLD and DM type 2.   Last seen in December a year ago by Dr. Tamala Julian. Wanting medication reductions.   Comes in today. Here with   Past Medical History:  Diagnosis Date  . Allergic rhinitis   . COPD (chronic obstructive pulmonary disease) ([place])   . Coronary artery disease   . Diabetes mellitus without complication ([place])   . Hyperlipidemia   . Pneumonia    once in his 90s & was hospitalized without intubation    Past Surgical History:  Procedure Laterality Date  . [place]  . CARDIAC CATHETERIZATION    . CORONARY STENT INTERVENTION  02/07/2019  . CORONARY STENT INTERVENTION N/A 02/07/2019   Procedure: CORONARY STENT INTERVENTION;  Surgeon: Wellington Hampshire, MD;  Location: [facility];  Service: Cardiovascular;  Laterality: N/A;  . INTRAVASCULAR ULTRASOUND/IVUS N/A 02/07/2019   Procedure: Intravascular Ultrasound/IVUS;  Surgeon: Wellington Hampshire, MD;  Location: [facility];  Service: Cardiovascular;  Laterality: N/A;  . LEFT HEART CATH AND CORONARY ANGIOGRAPHY N/A 02/07/2019   Procedure: LEFT HEART CATH AND CORONARY ANGIOGRAPHY;  Surgeon: Wellington Hampshire, MD;  Location: [facility];  Service: Cardiovascular;  Laterality: N/A;     Medications: No outpatient medications have been marked as taking for the 12/24/20 encounter (Appointment) with Burtis Junes, NP.     Allergies: No Known Allergies  Social History: The patient  reports that he quit smoking about 4 years ago. His smoking use included  cigarettes. He started smoking about 57 years ago. He has a 78.30 pack-year smoking history. He has never used smokeless tobacco. He reports that he does not drink alcohol and does not use drugs.   Family History: The patient's ***family history includes Aortic aneurysm in his paternal grandfather; Cancer in his brother; Diabetes in his mother and paternal grandmother; Hypertension in his mother; Lymphoma in his father.   Review of Systems: Please see the history of present illness.   All other systems are reviewed and negative.   Physical Exam: VS:  There were no vitals taken for this visit. Marland Kitchen  BMI There is no height or weight on file to calculate BMI.  Wt Readings from Last 3 Encounters:  12/12/19 168 lb 1.9 oz (76.3 kg)  09/19/19 164 lb 3.9 oz (74.5 kg)  08/29/19 163 lb (73.9 kg)    General: Pleasant. Well developed, well nourished and in no acute distress.   HEENT: Normal.  Neck: Supple, no JVD, carotid bruits, or masses noted.  Cardiac: ***Regular rate and rhythm. No murmurs, rubs, or gallops. No edema.  Respiratory:  Lungs are clear to auscultation bilaterally with normal work of breathing.  GI: Soft and nontender.  MS: No deformity or atrophy. Gait and ROM intact.  Skin: Warm and dry. Color is normal.  Neuro:  Strength and sensation are intact and no gross focal deficits noted.  Psych: Alert, appropriate and with normal affect.   LABORATORY DATA:  EKG:  EKG {ACTION; IS/IS [id number] ordered today.  Personally reviewed by me. This demonstrates ***.  Lab Results  Component Value Date   WBC 10.2 02/08/2019   HGB 12.9 (L) 02/08/2019   HCT 40.0 02/08/2019   PLT 156 02/08/2019   GLUCOSE 182 (H) 02/19/2019   CHOL 114 08/14/2019   TRIG 87 08/14/2019   HDL 36 (L) 08/14/2019   LDLCALC 61 08/14/2019   ALT 27 06/11/2019   AST 19 06/11/2019   NA 138 02/19/2019   K 4.2 02/19/2019   CL 102 02/19/2019   CREATININE 1.15 02/19/2019   BUN 20 02/19/2019   CO2 21 02/19/2019    TSH 2.205 02/06/2019   HGBA1C 7.0 (H) 02/06/2019     BNP (last 3 results) No results for input(s): BNP in the last 8760 hours.  ProBNP (last 3 results) No results for input(s): PROBNP in the last 8760 hours.   Other Studies Reviewed Today:  ECHO IMPRESSIONS 05/2019   1. The left ventricle has normal systolic function, with an ejection  fraction of 55-60%. The cavity size was normal. Left ventricular diastolic  Doppler parameters are consistent with impaired relaxation.  2. The right ventricle has normal systolic function. The cavity was  normal.  3. There is mild dilatation of the aortic root measuring 38 mm.  4. The mitral valve is grossly normal.  5. The tricuspid valve is grossly normal.  6. The aortic valve is tricuspid. Mild thickening of the aortic valve. No  stenosis of the aortic valve.  7. Normal LV systolic function; mild diastolic dysfunction; mildly  dilated aortic root; mild MR.     CORONARY STENT INTERVENTION 01/2019  Intravascular Ultrasound/IVUS  LEFT HEART CATH AND CORONARY ANGIOGRAPHY    Conclusion    There is mild left ventricular systolic dysfunction.  LV end diastolic pressure is normal.  The left ventricular ejection fraction is 45-50% by visual estimate.  Prox LAD to Mid LAD lesion is 99% stenosed.  Post intervention, there is a 0% residual stenosis.  A drug-eluting stent was successfully placed using a STENT RESOLUTE ONYX 2.75X30.   1.  Severe one-vessel coronary artery disease with 99% stenosis in the proximal/mid LAD with TIMI II flow.  No other obstructive disease. 2.  Mildly reduced LV systolic function with an EF of 45 to 50% with anterior apical hypokinesis.  Normal left ventricular end-diastolic pressure. 3.  Successful OCT guided PCI and drug-eluting stent placement to the LAD.  Recommendations: Dual antiplatelet therapy for at least 1 year. Aggressive treatment of risk factors.   ASSESSMENT:     1. Coronary artery  disease involving native coronary artery of native heart without angina pectoris   2. Hyperlipidemia LDL goal <70   3. Essential hypertension   4. Controlled type 2 diabetes mellitus with other circulatory complication, without long-term current use of insulin ([place])   5. Educated about COVID-19 virus infection     PLAN:     In order of problems listed above:   1. Secondary prevention discussed in detail.  Most important in his case is 150 minutes of moderate activity per week, LDL less than 70, and hemoglobin A1c less than 7.  Consider SGLT2 therapy.  We will go to monotherapy with antiplatelet agents discontinuing aspirin in February. 2. Most recent LDL was 71.  He is on max intensity atorvastatin. 3. Blood pressure is excellently controlled. 4. A1c should be less than 7.  Consider SGLT2 therapy. 5. Social distancing, mask wearing, and handwashing is  stressed to avoid COVID-19 infection.   Overall education and awareness concerning primary/secondary risk prevention was discussed in detail: LDL less than 70, hemoglobin A1c less than 7, blood pressure target less than 130/80 mmHg, >150 minutes of moderate aerobic activity per week, avoidance of smoking, weight control (via diet and exercise), and continued surveillance/management of/for obstructive sleep apnea.     Current medicines are reviewed with the patient today.  The patient does not have concerns regarding medicines other than what has been noted above.  The following changes have been made:  See above.  Labs/ tests ordered today include:   No orders of the defined types were placed in this encounter.    Disposition:   FU with *** in {gen number 2-67:124580} {Days to years:10300}.   Patient is agreeable to this plan and will call if any problems develop in the interim.   SignedTruitt Merle, NP  12/10/2020 7:50 AM  [facility] [address] [place], [place]  [address] Phone:  [phone] Fax: [phone]

## 2020-12-24 ENCOUNTER — Other Ambulatory Visit: Payer: Self-pay | Admitting: Interventional Cardiology

## 2020-12-24 ENCOUNTER — Ambulatory Visit: Payer: Medicare HMO | Admitting: Nurse Practitioner

## 2020-12-29 ENCOUNTER — Ambulatory Visit: Payer: Medicare HMO | Admitting: Interventional Cardiology

## 2021-01-19 NOTE — Progress Notes (Signed)
CARDIOLOGY OFFICE NOTE  Date:  02/02/2021    Billy Lawson Date of Birth: 1946/03/12 Medical Record #[id number]  PCP:  Lawerance Cruel, MD  Cardiologist:  Tamala Julian  Chief Complaint  Patient presents with  . Follow-up    Seen for Dr. Tamala Julian    History of Present Illness: Billy Lawson is a 75 y.o. male who presents today for a follow up visit. Seen for Dr. Tamala Julian.   He has a known history of CAD with prior NSTEMI in 01/2019 with DES to LAD, HLD and DM.   Last seen in December of 2020 by Dr. Tamala Julian - felt to be doing ok. Wanting medication reduction.   Comes in today. Here with his wife. Doing well. No chest pain. Fleeting dizziness if he gets up too quick. No passing out. Does great as long as he paces himself. Says he "has gotten use to all the medicines" now - previously on none. No regular exercise program. No NTG use. Has lost a little weight with Jardiance. Labs by PCP.    Past Medical History:  Diagnosis Date  . Allergic rhinitis   . COPD (chronic obstructive pulmonary disease) ([place])   . Coronary artery disease   . Diabetes mellitus without complication ([place])   . Hyperlipidemia   . Pneumonia    once in his 5s & was hospitalized without intubation    Past Surgical History:  Procedure Laterality Date  . [place]  . CARDIAC CATHETERIZATION    . CORONARY STENT INTERVENTION  02/07/2019  . CORONARY STENT INTERVENTION N/A 02/07/2019   Procedure: CORONARY STENT INTERVENTION;  Surgeon: Wellington Hampshire, MD;  Location: [facility];  Service: Cardiovascular;  Laterality: N/A;  . INTRAVASCULAR ULTRASOUND/IVUS N/A 02/07/2019   Procedure: Intravascular Ultrasound/IVUS;  Surgeon: Wellington Hampshire, MD;  Location: [facility];  Service: Cardiovascular;  Laterality: N/A;  . LEFT HEART CATH AND CORONARY ANGIOGRAPHY N/A 02/07/2019   Procedure: LEFT HEART CATH AND CORONARY ANGIOGRAPHY;  Surgeon: Wellington Hampshire, MD;  Location: [facility];  Service: Cardiovascular;  Laterality: N/A;     Medications: Current Meds  Medication Sig  . atorvastatin (LIPITOR) 80 MG tablet TAKE 1 TABLET BY MOUTH EVERY DAY AT 6 PM  . carvedilol (COREG) 3.125 MG tablet TAKE 1 TABLET BY MOUTH TWICE A DAY WITH FOOD  . clopidogrel (PLAVIX) 75 MG tablet TAKE 1 TABLET BY MOUTH EVERY DAY  . JARDIANCE 25 MG TABS tablet Take 25 mg by mouth daily.  Marland Kitchen losartan (COZAAR) 25 MG tablet Take 0.5 tablets (12.5 mg total) by mouth daily. Please keep upcoming appt in February 2022 for future refills. Thank you  . nitroGLYCERIN (NITROSTAT) 0.4 MG SL tablet Place 1 tablet (0.4 mg total) under the tongue every 5 (five) minutes x 3 doses as needed for chest pain.  Glory Rosebush VERIO test strip 1 each 2 (two) times daily.     Allergies: No Known Allergies  Social History: The patient  reports that he quit smoking about 5 years ago. His smoking use included cigarettes. He started smoking about 57 years ago. He has a 78.30 pack-year smoking history. He has never used smokeless tobacco. He reports that he does not drink alcohol and does not use drugs.   Family History: The patient's family history includes Aortic aneurysm in his paternal grandfather; Cancer in his brother; Diabetes in his mother and paternal grandmother; Hypertension in his mother;  Lymphoma in his father.   Review of Systems: Please see the history of present illness.   All other systems are reviewed and negative.   Physical Exam: VS:  BP 124/72   Pulse 65   Ht 5' 10.5" (1.791 m)   Wt 162 lb 12.8 oz (73.8 kg)   SpO2 99%   BMI 23.03 kg/m  .  BMI Body mass index is 23.03 kg/m.  Wt Readings from Last 3 Encounters:  02/02/21 162 lb 12.8 oz (73.8 kg)  12/12/19 168 lb 1.9 oz (76.3 kg)  09/19/19 164 lb 3.9 oz (74.5 kg)    General: Alert and in no acute distress.  His weight is down.  Cardiac: Regular rate and rhythm. No murmurs, rubs, or gallops. No edema.  Respiratory:  Lungs are  clear to auscultation bilaterally with normal work of breathing.  GI: Soft and nontender.  MS: No deformity or atrophy. Gait and ROM intact.  Skin: Warm and dry. Color is normal.  Neuro:  Strength and sensation are intact and no gross focal deficits noted.  Psych: Alert, appropriate and with normal affect.   LABORATORY DATA:  EKG:  EKG is ordered today.  Personally reviewed by me. This demonstrates sinus rhythm with 1st degree AV block - HR is 65.  Lab Results  Component Value Date   WBC 10.2 02/08/2019   HGB 12.9 (L) 02/08/2019   HCT 40.0 02/08/2019   PLT 156 02/08/2019   GLUCOSE 182 (H) 02/19/2019   CHOL 114 08/14/2019   TRIG 87 08/14/2019   HDL 36 (L) 08/14/2019   LDLCALC 61 08/14/2019   ALT 27 06/11/2019   AST 19 06/11/2019   NA 138 02/19/2019   K 4.2 02/19/2019   CL 102 02/19/2019   CREATININE 1.15 02/19/2019   BUN 20 02/19/2019   CO2 21 02/19/2019   TSH 2.205 02/06/2019   HGBA1C 7.0 (H) 02/06/2019       BNP (last 3 results) No results for input(s): BNP in the last 8760 hours.  ProBNP (last 3 results) No results for input(s): PROBNP in the last 8760 hours.   Other Studies Reviewed Today:   ECHO IMPRESSIONS 01/2019  1. The left ventricle has normal systolic function, with an ejection  fraction of 55-60%. The cavity size was normal. Left ventricular diastolic  Doppler parameters are consistent with impaired relaxation.  2. The right ventricle has normal systolic function. The cavity was  normal.  3. There is mild dilatation of the aortic root measuring 38 mm.  4. The mitral valve is grossly normal.  5. The tricuspid valve is grossly normal.  6. The aortic valve is tricuspid. Mild thickening of the aortic valve. No  stenosis of the aortic valve.  7. Normal LV systolic function; mild diastolic dysfunction; mildly  dilated aortic root; mild MR.     CORONARY STENT INTERVENTION 01/2019  Intravascular Ultrasound/IVUS  LEFT HEART CATH AND CORONARY  ANGIOGRAPHY   Conclusion    There is mild left ventricular systolic dysfunction.  LV end diastolic pressure is normal.  The left ventricular ejection fraction is 45-50% by visual estimate.  Prox LAD to Mid LAD lesion is 99% stenosed.  Post intervention, there is a 0% residual stenosis.  A drug-eluting stent was successfully placed using a STENT RESOLUTE ONYX 2.75X30.   1.  Severe one-vessel coronary artery disease with 99% stenosis in the proximal/mid LAD with TIMI II flow.  No other obstructive disease. 2.  Mildly reduced LV systolic function with an EF  of 45 to 50% with anterior apical hypokinesis.  Normal left ventricular end-diastolic pressure. 3.  Successful OCT guided PCI and drug-eluting stent placement to the LAD.  Recommendations: Dual antiplatelet therapy for at least 1 year. Aggressive treatment of risk factors.  ASSESSMENT & PLAN:     1. CAD with prior NSTEMI with PCI/DES to LAD - doing well - no worrisome symptoms - would keep on current regimen. On Plavix as monotherapy. Needs regular exercise and this was encouraged today.   2. HTN - BP looks fine - no changes made.   3. HLD - on statin - labs are checked by PCP - LDL was at goal from November.   4. DM - per PCP.   Current medicines are reviewed with the patient today.  The patient does not have concerns regarding medicines other than what has been noted above.  The following changes have been made:  See above.  Labs/ tests ordered today include:    Orders Placed This Encounter  Procedures  . EKG 12-Lead     Disposition:   FU with Dr. Tamala Julian in one year.       Patient is agreeable to this plan and will call if any problems develop in the interim.   SignedTruitt Merle, NP  02/02/2021 2:01 PM  [facility] [address] [place], [place]  [address] Phone: [phone] Fax: [phone]

## 2021-01-21 DIAGNOSIS — E1169 Type 2 diabetes mellitus with other specified complication: Secondary | ICD-10-CM | POA: Diagnosis not present

## 2021-01-21 DIAGNOSIS — J439 Emphysema, unspecified: Secondary | ICD-10-CM | POA: Diagnosis not present

## 2021-01-21 DIAGNOSIS — I213 ST elevation (STEMI) myocardial infarction of unspecified site: Secondary | ICD-10-CM | POA: Diagnosis not present

## 2021-01-21 DIAGNOSIS — E785 Hyperlipidemia, unspecified: Secondary | ICD-10-CM | POA: Diagnosis not present

## 2021-01-21 DIAGNOSIS — I2510 Atherosclerotic heart disease of native coronary artery without angina pectoris: Secondary | ICD-10-CM | POA: Diagnosis not present

## 2021-02-02 ENCOUNTER — Encounter: Payer: Self-pay | Admitting: Nurse Practitioner

## 2021-02-02 ENCOUNTER — Ambulatory Visit: Payer: Medicare HMO | Admitting: Nurse Practitioner

## 2021-02-02 ENCOUNTER — Other Ambulatory Visit: Payer: Self-pay

## 2021-02-02 VITALS — BP 124/72 | HR 65 | Ht 70.5 in | Wt 162.8 lb

## 2021-02-02 DIAGNOSIS — I208 Other forms of angina pectoris: Secondary | ICD-10-CM | POA: Diagnosis not present

## 2021-02-02 DIAGNOSIS — I2510 Atherosclerotic heart disease of native coronary artery without angina pectoris: Secondary | ICD-10-CM | POA: Diagnosis not present

## 2021-02-02 DIAGNOSIS — E1159 Type 2 diabetes mellitus with other circulatory complications: Secondary | ICD-10-CM | POA: Diagnosis not present

## 2021-02-02 DIAGNOSIS — I2089 Other forms of angina pectoris: Secondary | ICD-10-CM

## 2021-02-02 DIAGNOSIS — I10 Essential (primary) hypertension: Secondary | ICD-10-CM | POA: Diagnosis not present

## 2021-02-02 DIAGNOSIS — E785 Hyperlipidemia, unspecified: Secondary | ICD-10-CM | POA: Diagnosis not present

## 2021-02-02 DIAGNOSIS — N189 Chronic kidney disease, unspecified: Secondary | ICD-10-CM

## 2021-02-02 NOTE — Patient Instructions (Addendum)
After Visit Summary:  We will be checking the following labs today - NONE   Medication Instructions:    Continue with your current medicines.    If you need a refill on your cardiac medications before your next appointment, please call your pharmacy.     Testing/Procedures To Be Arranged:  N/A  Follow-Up:  See Dr. Tamala Julian in 6 months -  You will receive a reminder letter in the mail two months in advance. If you don't receive a letter, please call our office to schedule the follow-up appointment.    At [facility], you and your health needs are our priority.  As part of our continuing mission to provide you with exceptional heart care, we have created designated Provider Care Teams.  These Care Teams include your primary Cardiologist (physician) and Advanced Practice Providers (APPs -  Physician Assistants and Nurse Practitioners) who all work together to provide you with the care you need, when you need it.  Special Instructions:  . Stay safe, wash your hands for at least 20 seconds and wear a mask when needed.  . It was good to talk with you today.  . Regular exercise would be good.    Call the [place] office at [phone] if you have any questions, problems or concerns.

## 2021-03-20 ENCOUNTER — Other Ambulatory Visit: Payer: Self-pay | Admitting: Interventional Cardiology

## 2021-03-23 ENCOUNTER — Other Ambulatory Visit: Payer: Self-pay | Admitting: Interventional Cardiology

## 2021-04-27 DIAGNOSIS — E1169 Type 2 diabetes mellitus with other specified complication: Secondary | ICD-10-CM | POA: Diagnosis not present

## 2021-04-27 DIAGNOSIS — Z125 Encounter for screening for malignant neoplasm of prostate: Secondary | ICD-10-CM | POA: Diagnosis not present

## 2021-04-27 DIAGNOSIS — E785 Hyperlipidemia, unspecified: Secondary | ICD-10-CM | POA: Diagnosis not present

## 2021-04-27 DIAGNOSIS — Z79899 Other long term (current) drug therapy: Secondary | ICD-10-CM | POA: Diagnosis not present

## 2021-04-27 DIAGNOSIS — Z1159 Encounter for screening for other viral diseases: Secondary | ICD-10-CM | POA: Diagnosis not present

## 2021-04-28 DIAGNOSIS — E1169 Type 2 diabetes mellitus with other specified complication: Secondary | ICD-10-CM | POA: Diagnosis not present

## 2021-04-28 DIAGNOSIS — R5381 Other malaise: Secondary | ICD-10-CM | POA: Diagnosis not present

## 2021-05-05 DIAGNOSIS — E119 Type 2 diabetes mellitus without complications: Secondary | ICD-10-CM | POA: Diagnosis not present

## 2021-05-05 DIAGNOSIS — H33191 Other retinoschisis and retinal cysts, right eye: Secondary | ICD-10-CM | POA: Diagnosis not present

## 2021-05-05 DIAGNOSIS — H11131 Conjunctival pigmentations, right eye: Secondary | ICD-10-CM | POA: Diagnosis not present

## 2021-05-05 DIAGNOSIS — H2513 Age-related nuclear cataract, bilateral: Secondary | ICD-10-CM | POA: Diagnosis not present

## 2021-05-05 DIAGNOSIS — H3562 Retinal hemorrhage, left eye: Secondary | ICD-10-CM | POA: Diagnosis not present

## 2021-05-19 DIAGNOSIS — I213 ST elevation (STEMI) myocardial infarction of unspecified site: Secondary | ICD-10-CM | POA: Diagnosis not present

## 2021-05-19 DIAGNOSIS — E785 Hyperlipidemia, unspecified: Secondary | ICD-10-CM | POA: Diagnosis not present

## 2021-05-19 DIAGNOSIS — I2510 Atherosclerotic heart disease of native coronary artery without angina pectoris: Secondary | ICD-10-CM | POA: Diagnosis not present

## 2021-05-19 DIAGNOSIS — E1169 Type 2 diabetes mellitus with other specified complication: Secondary | ICD-10-CM | POA: Diagnosis not present

## 2021-05-19 DIAGNOSIS — J439 Emphysema, unspecified: Secondary | ICD-10-CM | POA: Diagnosis not present

## 2021-05-27 ENCOUNTER — Other Ambulatory Visit (HOSPITAL_BASED_OUTPATIENT_CLINIC_OR_DEPARTMENT_OTHER): Payer: Self-pay | Admitting: Acute Care

## 2021-05-27 ENCOUNTER — Ambulatory Visit (HOSPITAL_BASED_OUTPATIENT_CLINIC_OR_DEPARTMENT_OTHER): Payer: Medicare HMO

## 2021-05-27 DIAGNOSIS — Z87891 Personal history of nicotine dependence: Secondary | ICD-10-CM

## 2021-06-10 ENCOUNTER — Ambulatory Visit (HOSPITAL_BASED_OUTPATIENT_CLINIC_OR_DEPARTMENT_OTHER)
Admission: RE | Admit: 2021-06-10 | Discharge: 2021-06-10 | Disposition: A | Discharge status: Home / Self Care | Payer: Medicare HMO | Source: Ambulatory Visit | Attending: Acute Care | Admitting: Acute Care

## 2021-06-10 ENCOUNTER — Other Ambulatory Visit: Payer: Self-pay

## 2021-06-10 DIAGNOSIS — Z87891 Personal history of nicotine dependence: Secondary | ICD-10-CM | POA: Diagnosis not present

## 2021-06-16 DIAGNOSIS — H3562 Retinal hemorrhage, left eye: Secondary | ICD-10-CM | POA: Diagnosis not present

## 2021-06-16 DIAGNOSIS — H33191 Other retinoschisis and retinal cysts, right eye: Secondary | ICD-10-CM | POA: Diagnosis not present

## 2021-06-16 DIAGNOSIS — H2513 Age-related nuclear cataract, bilateral: Secondary | ICD-10-CM | POA: Diagnosis not present

## 2021-06-16 DIAGNOSIS — H11131 Conjunctival pigmentations, right eye: Secondary | ICD-10-CM | POA: Diagnosis not present

## 2021-06-16 DIAGNOSIS — E119 Type 2 diabetes mellitus without complications: Secondary | ICD-10-CM | POA: Diagnosis not present

## 2021-06-16 NOTE — Progress Notes (Signed)
Please call patient and let them  know their  low dose Ct was read as a Lung RADS 2: nodules that are benign in appearance and behavior with a very low likelihood of becoming a clinically active cancer due to size or lack of growth. Recommendation per radiology is for a repeat LDCT in 12 months. .Please let them  know we will order and schedule their  annual screening scan for 05/2022. Please let them  know there was notation of CAD on their  scan.  Please remind the patient  that this is a non-gated exam therefore degree or severity of disease  cannot be determined. Please have them  follow up with their PCP regarding potential risk factor modification, dietary therapy or pharmacologic therapy if clinically indicated. Pt.  is  currently on statin therapy. Please place order for annual  screening scan for  05/2022 and fax results to PCP. Thanks so much. There was notation of emphysema and Aortic atherosclerosis. He is on statin medications, he has had a recent echo ( 05/2019) Make sure he follows up with PCP for additional follow up as PCP feels is indicated. thanks

## 2021-06-17 ENCOUNTER — Encounter: Payer: Self-pay | Admitting: *Deleted

## 2021-06-17 DIAGNOSIS — Z87891 Personal history of nicotine dependence: Secondary | ICD-10-CM

## 2021-06-22 ENCOUNTER — Other Ambulatory Visit: Payer: Self-pay | Admitting: Interventional Cardiology

## 2021-07-20 IMAGING — CT CT CHEST LUNG CANCER SCREENING LOW DOSE W/O CM
1 of 2 series · 10 of 20 positions shown, 13 images · non-contrast
Comparison: 05/26/2020

CLINICAL DATA: 74-year-old male with greater than [AGE]
pack-year history of smoking. Lung cancer screening.

EXAM:
CT CHEST WITHOUT CONTRAST LOW-DOSE FOR LUNG CANCER SCREENING
TECHNIQUE: Multidetector CT imaging of the chest was performed following the
standard protocol without IV contrast.

[ct lung segmentation data · axial · 0.70mm/px · z∈[+1009,+1009]mm · 10 of 352 frames shown]
[frame 1/352  mediastinal]
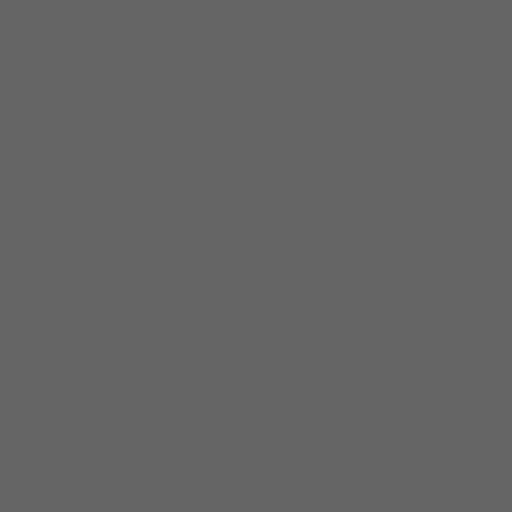
[frame 1/352  lung]
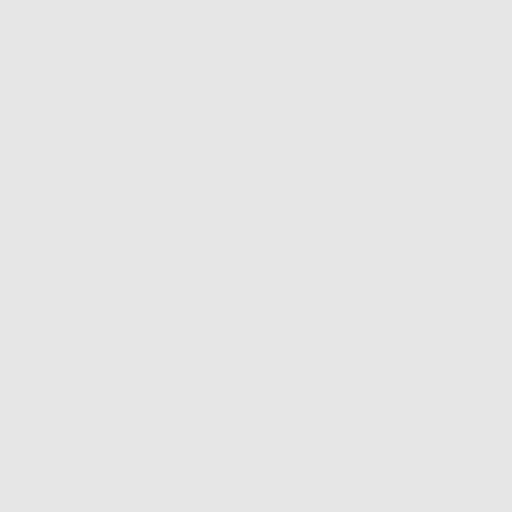
[frame 40/352  lung]
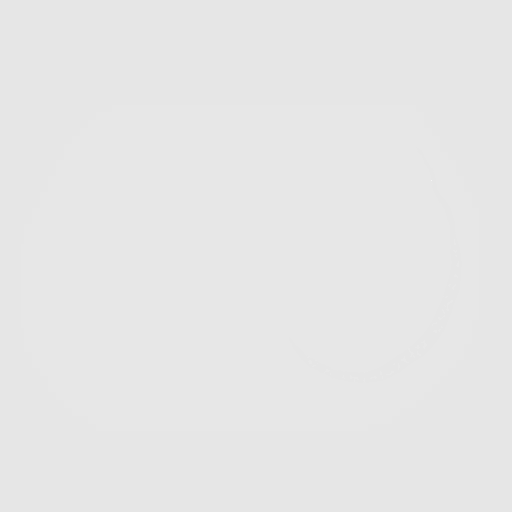
[frame 79/352  lung]
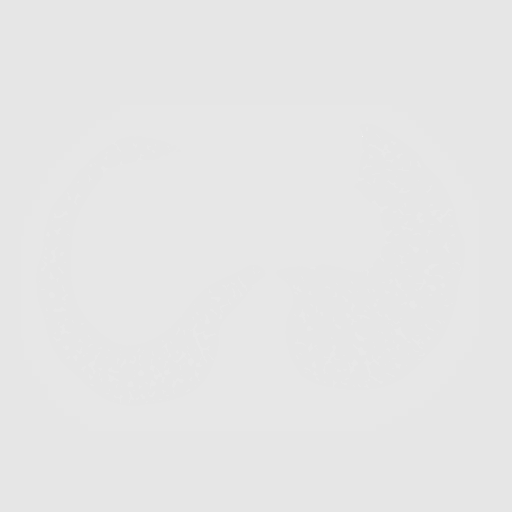
[frame 118/352  lung]
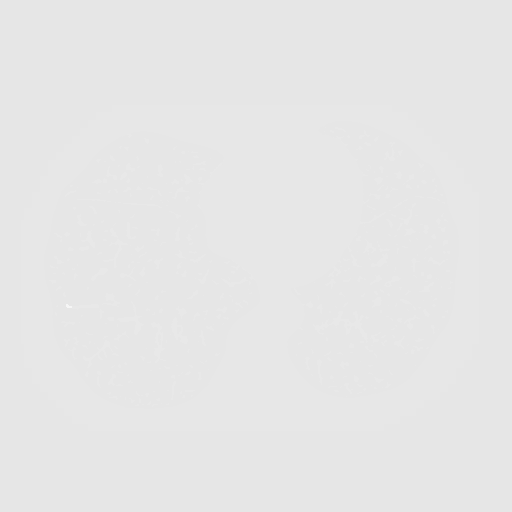
[frame 157/352  mediastinal]
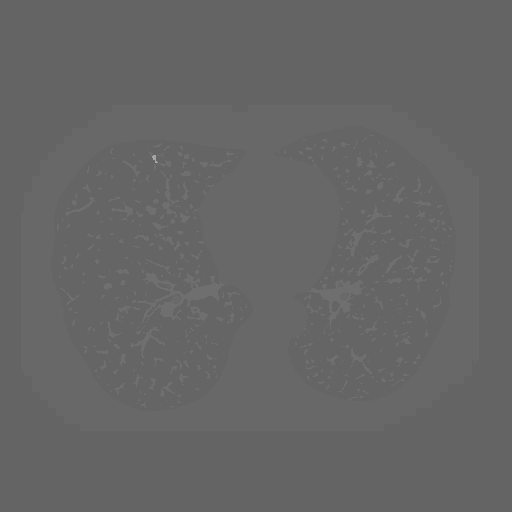
[frame 157/352  lung]
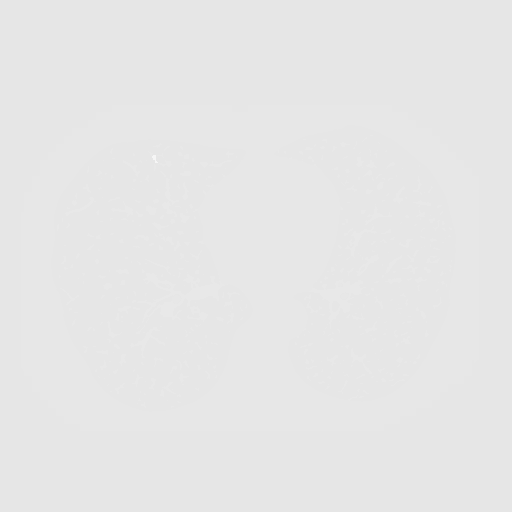
[frame 196/352  lung]
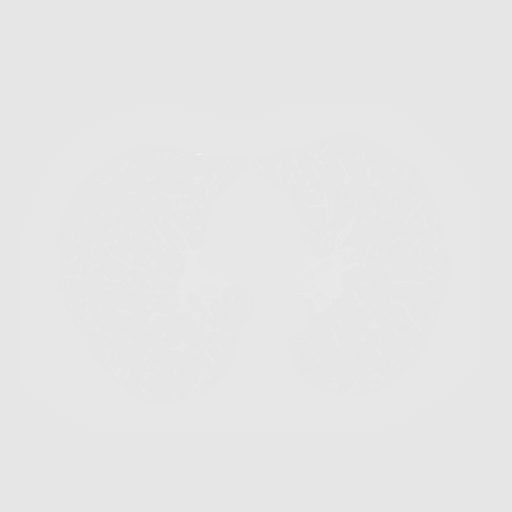
[frame 235/352  lung]
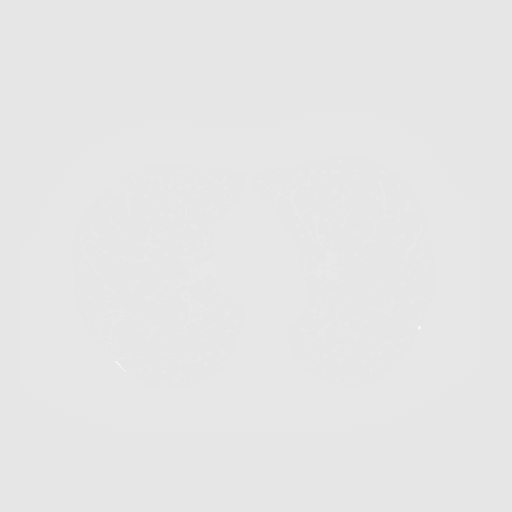
[frame 274/352  lung]
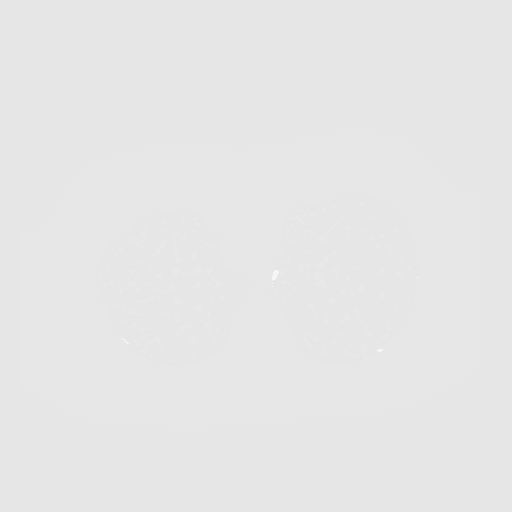
[frame 313/352  mediastinal]
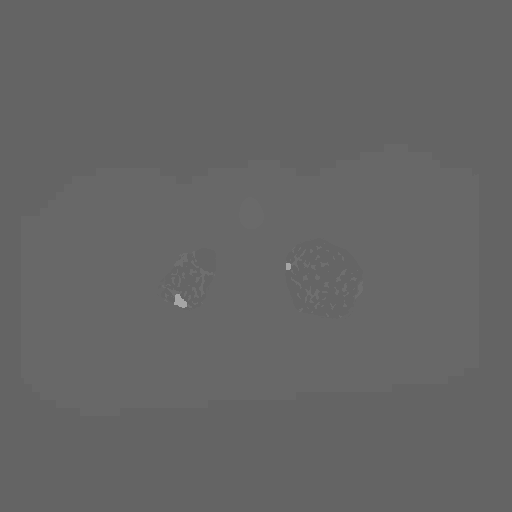
[frame 313/352  lung]
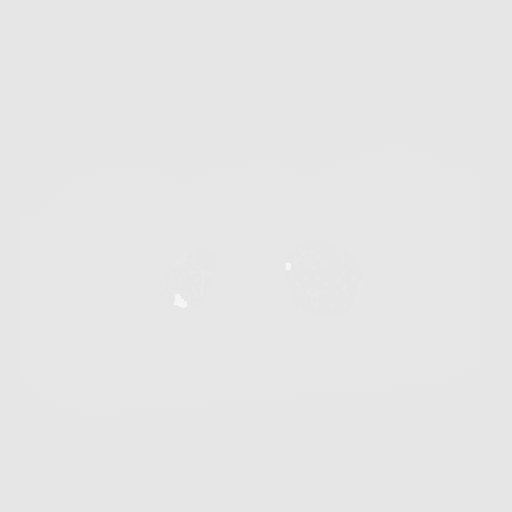
[frame 352/352  lung]
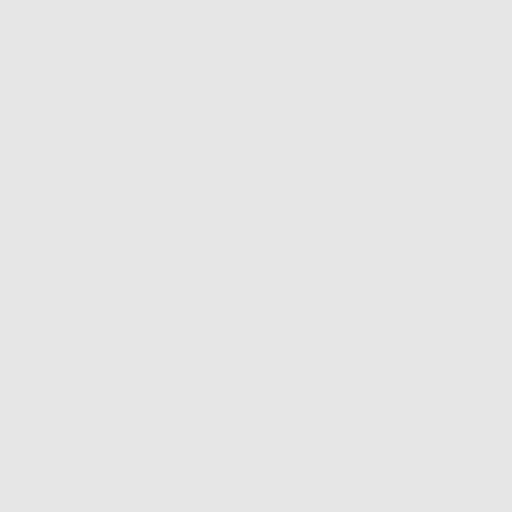

[10 of 20 positions shown; findings below may reference images not displayed]

FINDINGS: Cardiovascular: The heart size is normal. No substantial pericardial
effusion. Coronary artery calcification is evident. Atherosclerotic
calcification is noted in the wall of the thoracic aorta.

Mediastinum/Nodes: No mediastinal lymphadenopathy. No evidence for
gross hilar lymphadenopathy although assessment is limited by the
lack of intravenous contrast on today's study. The esophagus has
normal imaging features. There is no axillary lymphadenopathy.

Lungs/Pleura: Centrilobular and paraseptal emphysema evident.
Previously identified scattered tiny bilateral pulmonary nodules are
stable in the interval. Biapical pleuroparenchymal scarring is
similar. No new suspicious pulmonary nodule or mass. No focal
airspace consolidation. No pleural effusion.

Upper Abdomen: Unremarkable.

Musculoskeletal: No worrisome lytic or sclerotic osseous
abnormality.
IMPRESSION: 1. Lung-RADS 2, benign appearance or behavior. Continue annual
screening with low-dose chest CT without contrast in 12 months.
2.  Emphysema ([id number]-[id number].D) and Aortic Atherosclerosis (N3BCK-170.0)

## 2021-07-22 DIAGNOSIS — I213 ST elevation (STEMI) myocardial infarction of unspecified site: Secondary | ICD-10-CM | POA: Diagnosis not present

## 2021-07-22 DIAGNOSIS — I2510 Atherosclerotic heart disease of native coronary artery without angina pectoris: Secondary | ICD-10-CM | POA: Diagnosis not present

## 2021-07-22 DIAGNOSIS — J439 Emphysema, unspecified: Secondary | ICD-10-CM | POA: Diagnosis not present

## 2021-07-22 DIAGNOSIS — E1169 Type 2 diabetes mellitus with other specified complication: Secondary | ICD-10-CM | POA: Diagnosis not present

## 2021-07-22 DIAGNOSIS — E785 Hyperlipidemia, unspecified: Secondary | ICD-10-CM | POA: Diagnosis not present

## 2021-08-19 ENCOUNTER — Other Ambulatory Visit: Payer: Self-pay | Admitting: Interventional Cardiology

## 2021-08-31 DIAGNOSIS — I2510 Atherosclerotic heart disease of native coronary artery without angina pectoris: Secondary | ICD-10-CM | POA: Diagnosis not present

## 2021-08-31 DIAGNOSIS — J439 Emphysema, unspecified: Secondary | ICD-10-CM | POA: Diagnosis not present

## 2021-08-31 DIAGNOSIS — E785 Hyperlipidemia, unspecified: Secondary | ICD-10-CM | POA: Diagnosis not present

## 2021-08-31 DIAGNOSIS — E1169 Type 2 diabetes mellitus with other specified complication: Secondary | ICD-10-CM | POA: Diagnosis not present

## 2021-08-31 DIAGNOSIS — I213 ST elevation (STEMI) myocardial infarction of unspecified site: Secondary | ICD-10-CM | POA: Diagnosis not present

## 2021-09-04 DIAGNOSIS — R69 Illness, unspecified: Secondary | ICD-10-CM | POA: Diagnosis not present

## 2021-09-04 DIAGNOSIS — I25119 Atherosclerotic heart disease of native coronary artery with unspecified angina pectoris: Secondary | ICD-10-CM | POA: Diagnosis not present

## 2021-09-04 DIAGNOSIS — F3341 Major depressive disorder, recurrent, in partial remission: Secondary | ICD-10-CM | POA: Diagnosis not present

## 2021-09-04 DIAGNOSIS — G8929 Other chronic pain: Secondary | ICD-10-CM | POA: Diagnosis not present

## 2021-09-04 DIAGNOSIS — F419 Anxiety disorder, unspecified: Secondary | ICD-10-CM | POA: Diagnosis not present

## 2021-09-04 DIAGNOSIS — E1165 Type 2 diabetes mellitus with hyperglycemia: Secondary | ICD-10-CM | POA: Diagnosis not present

## 2021-09-04 DIAGNOSIS — I252 Old myocardial infarction: Secondary | ICD-10-CM | POA: Diagnosis not present

## 2021-09-04 DIAGNOSIS — F1021 Alcohol dependence, in remission: Secondary | ICD-10-CM | POA: Diagnosis not present

## 2021-09-04 DIAGNOSIS — E785 Hyperlipidemia, unspecified: Secondary | ICD-10-CM | POA: Diagnosis not present

## 2021-09-04 DIAGNOSIS — E1159 Type 2 diabetes mellitus with other circulatory complications: Secondary | ICD-10-CM | POA: Diagnosis not present

## 2021-09-04 DIAGNOSIS — I509 Heart failure, unspecified: Secondary | ICD-10-CM | POA: Diagnosis not present

## 2021-09-04 DIAGNOSIS — J439 Emphysema, unspecified: Secondary | ICD-10-CM | POA: Diagnosis not present

## 2021-09-04 DIAGNOSIS — I110 Hypertensive heart disease with heart failure: Secondary | ICD-10-CM | POA: Diagnosis not present

## 2021-09-04 DIAGNOSIS — Z008 Encounter for other general examination: Secondary | ICD-10-CM | POA: Diagnosis not present

## 2021-09-12 NOTE — Progress Notes (Signed)
Cardiology Office Note:    Date:  09/14/2021   ID:  Billy Lawson, DOB 22-Jan-1946, MRN [id number]  PCP:  Lawerance Cruel, MD  Cardiologist:  Sinclair Grooms, MD   Referring MD: Lawerance Cruel, MD   Chief Complaint  Patient presents with   Coronary Artery Disease   Hyperlipidemia   Follow-up    Nicotine addiction     History of Present Illness:    Billy Lawson is a 75 y.o. male with a hx of  CAD with NSTEMI 02/07/19 DES to LAD , hyperlipidemia, DM 2., COPD, and tobacco abuse.   He is accompanied by his wife.  He is doing relatively well.  He was started on 25 mg greater than 10 months ago and had significant weight loss.  The dose has been halved by his primary physician Dr. Melinda Crutch.  He has not had angina.  He frequently has lightheadedness and dizziness with standing from sitting.  He feels sluggish most of the time.  He has not had angina.  He has easy bruisability on clopidogrel.  Past Medical History:  Diagnosis Date   Allergic rhinitis    COPD (chronic obstructive pulmonary disease) ([place])    Coronary artery disease    Diabetes mellitus without complication ([place])    Hyperlipidemia    Pneumonia    once in his 25s & was hospitalized without intubation    Past Surgical History:  Procedure Laterality Date   [place] INTERVENTION  02/07/2019   CORONARY STENT INTERVENTION N/A 02/07/2019   Procedure: CORONARY STENT INTERVENTION;  Surgeon: Wellington Hampshire, MD;  Location: [facility];  Service: Cardiovascular;  Laterality: N/A;   INTRAVASCULAR ULTRASOUND/IVUS N/A 02/07/2019   Procedure: Intravascular Ultrasound/IVUS;  Surgeon: Wellington Hampshire, MD;  Location: [facility];  Service: Cardiovascular;  Laterality: N/A;   LEFT HEART CATH AND CORONARY ANGIOGRAPHY N/A 02/07/2019   Procedure: LEFT HEART CATH AND CORONARY ANGIOGRAPHY;  Surgeon: Wellington Hampshire, MD;  Location: [facility];  Service: Cardiovascular;  Laterality: N/A;    Current Medications: Current Meds  Medication Sig   atorvastatin (LIPITOR) 80 MG tablet TAKE 1 TABLET BY MOUTH EVERY DAY AT 6 PM   clopidogrel (PLAVIX) 75 MG tablet TAKE 1 TABLET BY MOUTH EVERY DAY   JARDIANCE 25 MG TABS tablet Take 12.5 mg by mouth daily.   nitroGLYCERIN (NITROSTAT) 0.4 MG SL tablet Place 1 tablet (0.4 mg total) under the tongue every 5 (five) minutes x 3 doses as needed for chest pain.   ONETOUCH VERIO test strip 1 each 2 (two) times daily.   [DISCONTINUED] carvedilol (COREG) 3.125 MG tablet TAKE 1 TABLET BY MOUTH TWICE A DAY WITH FOOD   [DISCONTINUED] losartan (COZAAR) 25 MG tablet Take 0.5 tablets (12.5 mg total) by mouth daily.     Allergies:   Patient has no known allergies.   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 Previously has lived in [place]. Moved to [place] in 1965. Previously worked in Advertising copywriter business. Enjoys reading and playing music - guitar & piano. No pets currently. No bird, mold, or hot tub exposure.    Social Determinants of Health    Financial Resource Strain: Not on file  Food Insecurity: Not on file  Transportation Needs: Not on file  Physical Activity: Not on file  Stress: Not on file  Social Connections: Not on file     Family History: The patient's family history includes Aortic aneurysm in his paternal grandfather; Cancer in his brother; Diabetes in his mother and paternal grandmother; Hypertension in his mother; Lymphoma in his father. There is no history of Lung disease.  ROS:   Please see the history of present illness.    Easy bruising.  Fatigue and orthostatic complaints.  Weight loss since starting 25 mg of Jardiance.  Dose has been halved by his primary care.  All other systems reviewed and are negative.  EKGs/Labs/Other Studies Reviewed:    The following studies were reviewed today:  2D Doppler echocardiogram 06/08/2019: IMPRESSIONS     1. The left ventricle has normal systolic function, with an ejection  fraction of 55-60%. The cavity size was normal. Left ventricular diastolic  Doppler parameters are consistent with impaired relaxation.   2. The right ventricle has normal systolic function. The cavity was  normal.   3. There is mild dilatation of the aortic root measuring 38 mm.   4. The mitral valve is grossly normal.   5. The tricuspid valve is grossly normal.   6. The aortic valve is tricuspid. Mild thickening of the aortic valve. No  stenosis of the aortic valve.   7. Normal LV systolic function; mild diastolic dysfunction; mildly  dilated aortic root; mild MR.    PCI February 2020: Intervention    EKG:  EKG  NSR with 1 degree AVB.  Recent Labs: No results found for requested labs within last 8760 hours.  Recent Lipid Panel    Component Value Date/Time   CHOL 114 08/14/2019 1032   TRIG 87 08/14/2019 1032   HDL 36 (L) 08/14/2019 1032   CHOLHDL 3.2 08/14/2019 1032   CHOLHDL 3.9 02/07/2019 0438   VLDL 8 02/07/2019 0438   LDLCALC 61 08/14/2019 1032    Physical Exam:     VS:  BP (!) 98/58   Pulse 91   Ht 5' 10.5" (1.791 m)   Wt 155 lb 12.8 oz (70.7 kg)   SpO2 97%   BMI 22.04 kg/m     Wt Readings from Last 3 Encounters:  09/14/21 155 lb 12.8 oz (70.7 kg)  02/02/21 162 lb 12.8 oz (73.8 kg)  12/12/19 168 lb 1.9 oz (76.3 kg)     GEN: Slender. No acute distress HEENT: Normal NECK: No JVD. LYMPHATICS: No lymphadenopathy CARDIAC: No murmur. RRR  no gallop, or edema. VASCULAR:  Normal Pulses. No bruits. RESPIRATORY:  Clear to auscultation without rales, wheezing or rhonchi  ABDOMEN: Soft, non-tender, non-distended, No pulsatile mass, MUSCULOSKELETAL: No deformity  SKIN: Warm and dry NEUROLOGIC:  Alert and oriented x 3 PSYCHIATRIC:  Normal affect   ASSESSMENT:    1. Stable angina (HCC)   2. Coronary artery disease involving native coronary artery of native heart without angina pectoris   3. Controlled type 2 diabetes mellitus with other circulatory complication, without long-term current use of insulin ([place])   4. Hyperlipidemia LDL goal <70   5. Essential hypertension   6. First degree atrioventricular block    PLAN:    In order of problems listed above:  Not having angina. Secondary prevention discussed. Continue lower dose Jardiance 10 mg/day. Continue atorvastatin 80 mg/day.  LDL 65 in May. Blood pressure is too low.  Discontinue low-dose losartan.  Decrease carvedilol to 3.125 mg each morning for 2 weeks then discontinue. As noted above, weight loss and current medication regimen are causing relative hypotension and likely symptoms of fatigue.  We will wean and discontinue the beta-blocker and ARB as LV function is now back into the normal range.   Overall education and awareness concerning primary risk prevention was discussed in detail: LDL less than 70, hemoglobin A1c less than 7, blood pressure target less than 130/80 mmHg, >150 minutes of moderate aerobic activity per week, avoidance of smoking, weight control (via diet and  exercise), and continued surveillance/management of/for obstructive sleep apnea.    Medication Adjustments/Labs and Tests Ordered: Current medicines are reviewed at length with the patient today.  Concerns regarding medicines are outlined above.  No orders of the defined types were placed in this encounter.  No orders of the defined types were placed in this encounter.   Patient Instructions  Medication Instructions:  1) DISCONTINUE Losartan 2) DECREASE Carvedilol to one tablet once a day for 2 weeks, then discontinue completely.    *If you need a refill on your cardiac medications before your next appointment, please call your pharmacy*   Lab Work: None If you have labs (blood work) drawn today and your tests are completely normal, you will receive your results only by: [place] (if you have MyChart) OR A paper copy in the mail If you have any lab test that is abnormal or we need to change your treatment, we will call you to review the results.   Testing/Procedures: None   Follow-Up: At [facility], you and your health needs are our priority.  As part of our continuing mission to provide you with exceptional heart care, we have created designated Provider Care Teams.  These Care Teams include your primary Cardiologist (physician) and Advanced Practice Providers (APPs -  Physician Assistants and Nurse Practitioners) who all work together to provide you with the care you need, when you need it.  We recommend signing up for the patient portal called "MyChart".  Sign up information is provided on this After Visit Summary.  MyChart is used to connect with patients for Virtual Visits (Telemedicine).  Patients are able to view lab/test results, encounter notes, upcoming appointments, etc.  Non-urgent messages can be sent to your provider as well.   To learn more about what you can do with MyChart, go to [email or web address].    Your next appointment:   1 year(s)  The  format for your next appointment:   In Person  Provider:   You may see Belva Crome  III, MD or one of the following Advanced Practice Providers on your designated Care Team:   Cecilie Kicks, NP   Other Instructions     Signed, Sinclair Grooms, MD  09/14/2021 5:17 PM    [place]

## 2021-09-14 ENCOUNTER — Ambulatory Visit: Payer: Medicare HMO | Admitting: Interventional Cardiology

## 2021-09-14 ENCOUNTER — Encounter: Payer: Self-pay | Admitting: Interventional Cardiology

## 2021-09-14 ENCOUNTER — Other Ambulatory Visit: Payer: Self-pay

## 2021-09-14 VITALS — BP 98/58 | HR 91 | Ht 70.5 in | Wt 155.8 lb

## 2021-09-14 DIAGNOSIS — I10 Essential (primary) hypertension: Secondary | ICD-10-CM | POA: Diagnosis not present

## 2021-09-14 DIAGNOSIS — I2510 Atherosclerotic heart disease of native coronary artery without angina pectoris: Secondary | ICD-10-CM | POA: Diagnosis not present

## 2021-09-14 DIAGNOSIS — E785 Hyperlipidemia, unspecified: Secondary | ICD-10-CM | POA: Diagnosis not present

## 2021-09-14 DIAGNOSIS — E1159 Type 2 diabetes mellitus with other circulatory complications: Secondary | ICD-10-CM | POA: Diagnosis not present

## 2021-09-14 DIAGNOSIS — I440 Atrioventricular block, first degree: Secondary | ICD-10-CM | POA: Diagnosis not present

## 2021-09-14 DIAGNOSIS — I208 Other forms of angina pectoris: Secondary | ICD-10-CM

## 2021-09-14 NOTE — Patient Instructions (Addendum)
Medication Instructions:  1) DISCONTINUE Losartan 2) DECREASE Carvedilol to one tablet once a day for 2 weeks, then discontinue completely.    *If you need a refill on your cardiac medications before your next appointment, please call your pharmacy*   Lab Work: None If you have labs (blood work) drawn today and your tests are completely normal, you will receive your results only by: [place] (if you have MyChart) OR A paper copy in the mail If you have any lab test that is abnormal or we need to change your treatment, we will call you to review the results.   Testing/Procedures: None   Follow-Up: At [facility], you and your health needs are our priority.  As part of our continuing mission to provide you with exceptional heart care, we have created designated Provider Care Teams.  These Care Teams include your primary Cardiologist (physician) and Advanced Practice Providers (APPs -  Physician Assistants and Nurse Practitioners) who all work together to provide you with the care you need, when you need it.  We recommend signing up for the patient portal called "MyChart".  Sign up information is provided on this After Visit Summary.  MyChart is used to connect with patients for Virtual Visits (Telemedicine).  Patients are able to view lab/test results, encounter notes, upcoming appointments, etc.  Non-urgent messages can be sent to your provider as well.   To learn more about what you can do with MyChart, go to [email or web address].    Your next appointment:   1 year(s)  The format for your next appointment:   In Person  Provider:   You may see Sinclair Grooms, MD or one of the following Advanced Practice Providers on your designated Care Team:   Cecilie Kicks, NP   Other Instructions

## 2021-09-18 DIAGNOSIS — E1169 Type 2 diabetes mellitus with other specified complication: Secondary | ICD-10-CM | POA: Diagnosis not present

## 2021-09-23 DIAGNOSIS — I700 Atherosclerosis of aorta: Secondary | ICD-10-CM | POA: Diagnosis not present

## 2021-09-23 DIAGNOSIS — E1169 Type 2 diabetes mellitus with other specified complication: Secondary | ICD-10-CM | POA: Diagnosis not present

## 2021-09-23 DIAGNOSIS — J439 Emphysema, unspecified: Secondary | ICD-10-CM | POA: Diagnosis not present

## 2021-09-23 DIAGNOSIS — E785 Hyperlipidemia, unspecified: Secondary | ICD-10-CM | POA: Diagnosis not present

## 2021-09-23 DIAGNOSIS — Z0000 Encounter for general adult medical examination without abnormal findings: Secondary | ICD-10-CM | POA: Diagnosis not present

## 2021-09-23 DIAGNOSIS — R69 Illness, unspecified: Secondary | ICD-10-CM | POA: Diagnosis not present

## 2021-11-26 DIAGNOSIS — J439 Emphysema, unspecified: Secondary | ICD-10-CM | POA: Diagnosis not present

## 2021-11-26 DIAGNOSIS — I213 ST elevation (STEMI) myocardial infarction of unspecified site: Secondary | ICD-10-CM | POA: Diagnosis not present

## 2021-11-26 DIAGNOSIS — E1169 Type 2 diabetes mellitus with other specified complication: Secondary | ICD-10-CM | POA: Diagnosis not present

## 2021-11-26 DIAGNOSIS — E785 Hyperlipidemia, unspecified: Secondary | ICD-10-CM | POA: Diagnosis not present

## 2021-11-26 DIAGNOSIS — I2510 Atherosclerotic heart disease of native coronary artery without angina pectoris: Secondary | ICD-10-CM | POA: Diagnosis not present

## 2021-12-27 ENCOUNTER — Other Ambulatory Visit: Payer: Self-pay | Admitting: Interventional Cardiology

## 2022-02-23 ENCOUNTER — Other Ambulatory Visit: Payer: Self-pay | Admitting: Interventional Cardiology

## 2022-03-23 DIAGNOSIS — E785 Hyperlipidemia, unspecified: Secondary | ICD-10-CM | POA: Diagnosis not present

## 2022-03-23 DIAGNOSIS — E1169 Type 2 diabetes mellitus with other specified complication: Secondary | ICD-10-CM | POA: Diagnosis not present

## 2022-03-23 DIAGNOSIS — Z125 Encounter for screening for malignant neoplasm of prostate: Secondary | ICD-10-CM | POA: Diagnosis not present

## 2022-04-07 DIAGNOSIS — F1021 Alcohol dependence, in remission: Secondary | ICD-10-CM | POA: Diagnosis not present

## 2022-04-07 DIAGNOSIS — I2510 Atherosclerotic heart disease of native coronary artery without angina pectoris: Secondary | ICD-10-CM | POA: Diagnosis not present

## 2022-04-07 DIAGNOSIS — J309 Allergic rhinitis, unspecified: Secondary | ICD-10-CM | POA: Diagnosis not present

## 2022-04-07 DIAGNOSIS — I700 Atherosclerosis of aorta: Secondary | ICD-10-CM | POA: Diagnosis not present

## 2022-04-07 DIAGNOSIS — J439 Emphysema, unspecified: Secondary | ICD-10-CM | POA: Diagnosis not present

## 2022-04-07 DIAGNOSIS — R69 Illness, unspecified: Secondary | ICD-10-CM | POA: Diagnosis not present

## 2022-04-07 DIAGNOSIS — Z125 Encounter for screening for malignant neoplasm of prostate: Secondary | ICD-10-CM | POA: Diagnosis not present

## 2022-04-07 DIAGNOSIS — E785 Hyperlipidemia, unspecified: Secondary | ICD-10-CM | POA: Diagnosis not present

## 2022-04-07 DIAGNOSIS — H6980 Other specified disorders of Eustachian tube, unspecified ear: Secondary | ICD-10-CM | POA: Diagnosis not present

## 2022-04-12 DIAGNOSIS — H3562 Retinal hemorrhage, left eye: Secondary | ICD-10-CM | POA: Diagnosis not present

## 2022-04-12 DIAGNOSIS — H2513 Age-related nuclear cataract, bilateral: Secondary | ICD-10-CM | POA: Diagnosis not present

## 2022-04-12 DIAGNOSIS — H33191 Other retinoschisis and retinal cysts, right eye: Secondary | ICD-10-CM | POA: Diagnosis not present

## 2022-04-12 DIAGNOSIS — H11131 Conjunctival pigmentations, right eye: Secondary | ICD-10-CM | POA: Diagnosis not present

## 2022-04-12 DIAGNOSIS — H35372 Puckering of macula, left eye: Secondary | ICD-10-CM | POA: Diagnosis not present

## 2022-04-12 DIAGNOSIS — E119 Type 2 diabetes mellitus without complications: Secondary | ICD-10-CM | POA: Diagnosis not present

## 2022-04-20 DIAGNOSIS — I2510 Atherosclerotic heart disease of native coronary artery without angina pectoris: Secondary | ICD-10-CM | POA: Diagnosis not present

## 2022-04-20 DIAGNOSIS — E1169 Type 2 diabetes mellitus with other specified complication: Secondary | ICD-10-CM | POA: Diagnosis not present

## 2022-06-10 ENCOUNTER — Ambulatory Visit (HOSPITAL_BASED_OUTPATIENT_CLINIC_OR_DEPARTMENT_OTHER)
Admission: RE | Admit: 2022-06-10 | Discharge: 2022-06-10 | Disposition: A | Discharge status: Home / Self Care | Payer: Medicare HMO | Source: Ambulatory Visit | Attending: Family Medicine | Admitting: Family Medicine

## 2022-06-10 DIAGNOSIS — Z87891 Personal history of nicotine dependence: Secondary | ICD-10-CM | POA: Diagnosis not present

## 2022-06-11 ENCOUNTER — Other Ambulatory Visit: Payer: Self-pay | Admitting: Acute Care

## 2022-06-11 DIAGNOSIS — Z122 Encounter for screening for malignant neoplasm of respiratory organs: Secondary | ICD-10-CM

## 2022-06-11 DIAGNOSIS — Z87891 Personal history of nicotine dependence: Secondary | ICD-10-CM

## 2022-08-28 ENCOUNTER — Other Ambulatory Visit: Payer: Self-pay | Admitting: Interventional Cardiology

## 2022-09-13 DIAGNOSIS — E1169 Type 2 diabetes mellitus with other specified complication: Secondary | ICD-10-CM | POA: Diagnosis not present

## 2022-09-24 ENCOUNTER — Other Ambulatory Visit: Payer: Self-pay | Admitting: Interventional Cardiology

## 2022-10-01 ENCOUNTER — Other Ambulatory Visit: Payer: Self-pay | Admitting: Interventional Cardiology

## 2022-10-05 ENCOUNTER — Other Ambulatory Visit: Payer: Self-pay | Admitting: Interventional Cardiology

## 2022-10-11 ENCOUNTER — Other Ambulatory Visit: Payer: Self-pay | Admitting: Interventional Cardiology

## 2022-10-11 DIAGNOSIS — Z0000 Encounter for general adult medical examination without abnormal findings: Secondary | ICD-10-CM | POA: Diagnosis not present

## 2022-10-11 DIAGNOSIS — Z6822 Body mass index (BMI) 22.0-22.9, adult: Secondary | ICD-10-CM | POA: Diagnosis not present

## 2022-10-11 DIAGNOSIS — Z23 Encounter for immunization: Secondary | ICD-10-CM | POA: Diagnosis not present

## 2022-11-03 DIAGNOSIS — M720 Palmar fascial fibromatosis [Dupuytren]: Secondary | ICD-10-CM | POA: Diagnosis not present

## 2022-11-05 ENCOUNTER — Other Ambulatory Visit: Payer: Self-pay | Admitting: Interventional Cardiology

## 2022-11-08 ENCOUNTER — Other Ambulatory Visit: Payer: Self-pay | Admitting: Interventional Cardiology

## 2022-11-09 ENCOUNTER — Telehealth: Payer: Self-pay

## 2022-11-09 ENCOUNTER — Telehealth: Payer: Self-pay | Admitting: Interventional Cardiology

## 2022-11-09 NOTE — Telephone Encounter (Signed)
..     Pre-operative Risk Assessment    Patient Name: Billy Lawson  DOB: [birth year] MRN: [id number]      Request for Surgical Clearance    Procedure:  ENZYMATIC TREATMENT  Date of Surgery:  Clearance TBD                                 Surgeon:  DR Luiz Ochoa Surgeon's Group or Practice Name:  [facility] Phone number:  [phone] Fax number:  [phone]   Type of Clearance Requested:   - Medical  - Pharmacy:  Hold Clopidogrel (Plavix) TYPICALLY HOLD 5 TO 7 DAYS BEFORE AND 5 TO 7 DAYS AFTER   Type of Anesthesia:  Not Indicated   Additional requests/questions:    Gwenlyn Found   11/09/2022, 11:43 AM

## 2022-11-09 NOTE — Telephone Encounter (Signed)
After reviewing the images of the patient's LAD intervention in 2020, it would be safe to hold the patient's Plavix 5 to 7 days pre and 7 days post Dupuytren's contracture therapy by Dr. Amedeo Plenty.  You will be seeing patient Dec. 3, 2023.

## 2022-11-09 NOTE — Telephone Encounter (Signed)
Preoperative team, please contact requesting office and clarify treatment being performed/surgery.  Once we understand details surrounding surgery we will be able to provide recommendations.  Thank you for your help.  Jossie Ng. Hailie Searight NP-C     11/09/2022, 1:43 PM [facility] [address] Office [phone] Fax [phone]

## 2022-11-15 NOTE — Telephone Encounter (Signed)
I tried x 3 to reach Dr. Amedeo Plenty surgery scheduler to clarify procedure per the pre op provider Coletta Memos, FNP. I was unable to get through the phone lines in order to s/w someone to clarify procedure.   Pt has appt 11/23/22 with Ambrose Pancoast, NP. I have added need pre op clearance to appt notes.

## 2022-11-21 NOTE — Telephone Encounter (Signed)
Making sure you saw this.Marland KitchenMarland Kitchen

## 2022-11-23 ENCOUNTER — Ambulatory Visit: Payer: Medicare HMO | Admitting: Nurse Practitioner

## 2022-11-25 ENCOUNTER — Other Ambulatory Visit: Payer: Self-pay | Admitting: Interventional Cardiology

## 2022-12-01 ENCOUNTER — Other Ambulatory Visit: Payer: Self-pay | Admitting: Interventional Cardiology

## 2022-12-01 NOTE — Progress Notes (Unsigned)
Office Visit    Patient Name: Billy Lawson Date of Encounter: 12/01/2022  Primary Care Provider:  Lawerance Cruel, MD Primary Cardiologist:  Sinclair Grooms, MD Primary Electrophysiologist: None  Chief Complaint    Billy Lawson is a 76 y.o. male with PMH of CAD s/p NSTEMI 2020 with DES to LAD, DM type II, HLD, COPD, tobacco abuse who presents today for preoperative clearance.  Past Medical History    Past Medical History:  Diagnosis Date   Allergic rhinitis    COPD (chronic obstructive pulmonary disease) ([place])    Coronary artery disease    Diabetes mellitus without complication ([place])    Hyperlipidemia    Pneumonia    once in his 45s & was hospitalized without intubation   Past Surgical History:  Procedure Laterality Date   [place] INTERVENTION  02/07/2019   CORONARY STENT INTERVENTION N/A 02/07/2019   Procedure: CORONARY STENT INTERVENTION;  Surgeon: Wellington Hampshire, MD;  Location: [facility];  Service: Cardiovascular;  Laterality: N/A;   INTRAVASCULAR ULTRASOUND/IVUS N/A 02/07/2019   Procedure: Intravascular Ultrasound/IVUS;  Surgeon: Wellington Hampshire, MD;  Location: [facility];  Service: Cardiovascular;  Laterality: N/A;   LEFT HEART CATH AND CORONARY ANGIOGRAPHY N/A 02/07/2019   Procedure: LEFT HEART CATH AND CORONARY ANGIOGRAPHY;  Surgeon: Wellington Hampshire, MD;  Location: [facility];  Service: Cardiovascular;  Laterality: N/A;    Allergies  No Known Allergies  History of Present Illness    Billy Lawson  is a 76 year old male with the above mention past medical history who presents today for preoperative surgical clearance.  Mr. [name] was recently seen in 2020 by Dr. Tamala Julian for NSTEMI presentation.  LHC was performed revealing severe one-vessel proximal mid LAD lesion that was treated with DES x 1.  2D echo completed showing EF of 45-50% with  apical hypokinesis and normal LV end-diastolic pressure.  He was seen in follow-up and was doing well and tolerating DAPT.  2D echo was repeated revealing recovery of EF at 55 to 60% and mild dilation of aortic root 38 mm.  Doing well with no anginal symptoms and was last seen by Dr. Tamala Julian on 08/2021 for follow-up.  He reported some frequent lightheadedness and dizziness with standing from sitting but does not endorse any angina.  He was also noted to bruise easily with clopidogrel and LDL cholesterol was at goal.  His blood pressures were running very low and low-dose losartan was discontinued as well as carvedilol.  He was given parameters to lower dose to 3.25 mg for 2 weeks then discontinue.  Since last being seen in the office patient reports***.  Patient denies chest pain, palpitations, dyspnea, PND, orthopnea, nausea, vomiting, dizziness, syncope, edema, weight gain, or early satiety.   ***Notes:  Plavix 5 to 7 days pre and 7 days post Dupuytren's contracture therapy by Dr. Amedeo Plenty.  Home Medications    Current Outpatient Medications  Medication Sig Dispense Refill   atorvastatin (LIPITOR) 80 MG tablet TAKE 1 TABLET BY MOUTH EVERY DAY AT 6 PM 30 tablet 0   clopidogrel (PLAVIX) 75 MG tablet TAKE 1 TABLET BY MOUTH EVERY DAY 30 tablet 0   JARDIANCE 25 MG TABS tablet Take 12.5 mg by mouth daily.     nitroGLYCERIN (NITROSTAT) 0.4 MG SL tablet Place 1 tablet (0.4 mg  total) under the tongue every 5 (five) minutes x 3 doses as needed for chest pain. 28 tablet 1   ONETOUCH VERIO test strip 1 each 2 (two) times daily.     No current facility-administered medications for this visit.     Review of Systems  Please see the history of present illness.    (+)*** (+)***  All other systems reviewed and are otherwise negative except as noted above.  Physical Exam    Wt Readings from Last 3 Encounters:  06/10/22 155 lb (70.3 kg)  09/14/21 155 lb 12.8 oz (70.7 kg)  02/02/21 162 lb 12.8 oz (73.8 kg)    ZO:XWRUE were no vitals filed for this visit.,There is no height or weight on file to calculate BMI.  Constitutional:      Appearance: Healthy appearance. Not in distress.  Neck:     Vascular: JVD normal.  Pulmonary:     Effort: Pulmonary effort is normal.     Breath sounds: No wheezing. No rales. Diminished in the bases Cardiovascular:     Normal rate. Regular rhythm. Normal S1. Normal S2.      Murmurs: There is no murmur.  Edema:    Peripheral edema absent.  Abdominal:     Palpations: Abdomen is soft non tender. There is no hepatomegaly.  Skin:    General: Skin is warm and dry.  Neurological:     General: No focal deficit present.     Mental Status: Alert and oriented to person, place and time.     Cranial Nerves: Cranial nerves are intact.  EKG/LABS/Other Studies Reviewed    ECG personally reviewed by me today - ***  Risk Assessment/Calculations:   {Does this patient have ATRIAL FIBRILLATION?:4248300319}        Lab Results  Component Value Date   WBC 10.2 02/08/2019   HGB 12.9 (L) 02/08/2019   HCT 40.0 02/08/2019   MCV 85.5 02/08/2019   PLT 156 02/08/2019   Lab Results  Component Value Date   CREATININE 1.15 02/19/2019   BUN 20 02/19/2019   NA 138 02/19/2019   K 4.2 02/19/2019   CL 102 02/19/2019   CO2 21 02/19/2019   Lab Results  Component Value Date   ALT 27 06/11/2019   AST 19 06/11/2019   ALKPHOS 45 06/11/2019   BILITOT 0.3 06/11/2019   Lab Results  Component Value Date   CHOL 114 08/14/2019   HDL 36 (L) 08/14/2019   LDLCALC 61 08/14/2019   TRIG 87 08/14/2019   CHOLHDL 3.2 08/14/2019    Lab Results  Component Value Date   HGBA1C 7.0 (H) 02/06/2019    Assessment & Plan    1.  Surgical clearance: -{Click Here to Calculate RCRI      :454098119}  { Click Here to Calculate DASI      :147829562} {Select to add RCRI Risk (<1%=LOW; >/=1%=HIGH) (Optional):21036017}  {Select if HIGH (RCRI >/=1%) Risk  (Optional):21036030} Recommendations: {2014 ACC/AHA Perioperative Guidelines  :21036001} Antiplatelet and/or Anticoagulation Recommendations: {Antiplatelet Recommendations                  :21036016} {Anticoagulation Recommendations           :13086578}    2.  Coronary artery disease  3.  Essential hypertension  4.  Hyperlipidemia      Disposition: Follow-up with Belva Crome III, MD or APP in *** months {Are you ordering a CV Procedure (e.g. stress test, cath, DCCV, TEE, etc)?   Press F2        :  718550158}   Medication Adjustments/Labs and Tests Ordered: Current medicines are reviewed at length with the patient today.  Concerns regarding medicines are outlined above.   Signed, Mable Fill, Marissa Nestle, NP 12/01/2022, 9:34 PM  [facility]  Note:  This document was prepared using Dragon voice recognition software and may include unintentional dictation errors.

## 2022-12-02 ENCOUNTER — Encounter: Payer: Self-pay | Admitting: Nurse Practitioner

## 2022-12-02 ENCOUNTER — Ambulatory Visit: Payer: Medicare HMO | Attending: Nurse Practitioner | Admitting: Nurse Practitioner

## 2022-12-02 VITALS — BP 132/80 | HR 76 | Ht 71.0 in | Wt 159.0 lb

## 2022-12-02 DIAGNOSIS — I2510 Atherosclerotic heart disease of native coronary artery without angina pectoris: Secondary | ICD-10-CM | POA: Diagnosis not present

## 2022-12-02 DIAGNOSIS — I10 Essential (primary) hypertension: Secondary | ICD-10-CM

## 2022-12-02 DIAGNOSIS — F17208 Nicotine dependence, unspecified, with other nicotine-induced disorders: Secondary | ICD-10-CM

## 2022-12-02 DIAGNOSIS — Z01810 Encounter for preprocedural cardiovascular examination: Secondary | ICD-10-CM | POA: Diagnosis not present

## 2022-12-02 DIAGNOSIS — E785 Hyperlipidemia, unspecified: Secondary | ICD-10-CM | POA: Diagnosis not present

## 2022-12-02 DIAGNOSIS — R69 Illness, unspecified: Secondary | ICD-10-CM | POA: Diagnosis not present

## 2022-12-02 NOTE — Patient Instructions (Signed)
Medication Instructions:  HOLD PLAVIX 7 DAYS PRIOR TO PROCEDURE AND 7 DAYS AFTER *If you need a refill on your cardiac medications before your next appointment, please call your pharmacy*   Lab Work: NONE ORDERED   Testing/Procedures: NONE ORDERED   Follow-Up: At [facility], you and your health needs are our priority.  As part of our continuing mission to provide you with exceptional heart care, we have created designated Provider Care Teams.  These Care Teams include your primary Cardiologist (physician) and Advanced Practice Providers (APPs -  Physician Assistants and Nurse Practitioners) who all work together to provide you with the care you need, when you need it.  We recommend signing up for the patient portal called "MyChart".  Sign up information is provided on this After Visit Summary.  MyChart is used to connect with patients for Virtual Visits (Telemedicine).  Patients are able to view lab/test results, encounter notes, upcoming appointments, etc.  Non-urgent messages can be sent to your provider as well.   To learn more about what you can do with MyChart, go to [email or web address].    Your next appointment:   TBD  The format for your next appointment:   In Person  Provider:   PATIENT WILL DECIDE AT [place]  Other Instructions POSSIBLE PROVIDERS: Dr Burt Knack Dr Irish Lack Dr Angelena Form Dr Ali Lowe  Important Information About Sugar

## 2022-12-05 ENCOUNTER — Other Ambulatory Visit: Payer: Self-pay | Admitting: Interventional Cardiology

## 2022-12-25 ENCOUNTER — Other Ambulatory Visit: Payer: Self-pay | Admitting: Interventional Cardiology

## 2022-12-27 DIAGNOSIS — M720 Palmar fascial fibromatosis [Dupuytren]: Secondary | ICD-10-CM | POA: Diagnosis not present

## 2022-12-29 DIAGNOSIS — M25642 Stiffness of left hand, not elsewhere classified: Secondary | ICD-10-CM | POA: Diagnosis not present

## 2022-12-29 DIAGNOSIS — M720 Palmar fascial fibromatosis [Dupuytren]: Secondary | ICD-10-CM | POA: Diagnosis not present

## 2023-01-04 ENCOUNTER — Ambulatory Visit (INDEPENDENT_AMBULATORY_CARE_PROVIDER_SITE_OTHER): Payer: Medicare HMO | Admitting: Psychologist

## 2023-01-04 DIAGNOSIS — F320 Major depressive disorder, single episode, mild: Secondary | ICD-10-CM

## 2023-01-04 DIAGNOSIS — R69 Illness, unspecified: Secondary | ICD-10-CM | POA: Diagnosis not present

## 2023-01-04 NOTE — Progress Notes (Signed)
                Corsica Franson, PsyD 

## 2023-01-04 NOTE — Progress Notes (Signed)
[facility] Counselor Initial Adult Exam  Name: Billy Lawson Date: 01/04/2023 MRN: [id number] DOB: [birth year] PCP: Lawerance Cruel, MD  Time spent: 03:15 pm to 4:00 pm; total time: 45 minutes  This session was held via in person. The patient consented to in-person therapy and was in the clinician's office. Limits of confidentiality were discussed with the patient.    Guardian/Payee:  NA    Paperwork requested: No   Reason for Visit /Presenting Problem: Some depression and possible nicotine addiction  Mental Status Exam: Appearance:   Well Groomed     Behavior:  Appropriate  Motor:  Normal  Speech/Language:   Clear and Coherent  Affect:  Appropriate  Mood:  normal  Thought process:  normal  Thought content:    WNL  Sensory/Perceptual disturbances:    WNL  Orientation:  oriented to person, place, and time/date  Attention:  Good  Concentration:  Good  Memory:  WNL  Fund of knowledge:   Good  Insight:    Good  Judgment:   Good  Impulse Control:  Good     Reported Symptoms:  The patient endorsed experiencing the following: feeling sad, down, low self-esteem, rumination of thoughts, fatigue, and sometimes lack of motivation. He denied suicidal and homicidal ideation.   Risk Assessment: Danger to Self:  No Self-injurious Behavior: No Danger to Others: No Duty to Warn:no Physical Aggression / Violence:No  Access to Firearms a concern: No  Gang Involvement:No  Patient / guardian was educated about steps to take if suicide or homicide risk level increases between visits: n/a While future psychiatric events cannot be accurately predicted, the patient does not currently require acute inpatient psychiatric care and does not currently meet [facility] involuntary commitment criteria.  Substance Abuse History: Current substance abuse:  Patient is using nicotine mints     Past Psychiatric History:   Previous psychological history is significant for  anxiety Outpatient Providers:NA History of Psych Hospitalization: No  Psychological Testing:  NA    Abuse History:  Victim of: No.,  NA    Report needed: No. Victim of Neglect:No. Perpetrator of  NA   Witness / Exposure to Domestic Violence: No   Protective Services Involvement: No  Witness to [employer] Violence:  No   Family History:  Family History  Problem Relation Age of Onset   Lymphoma Father    Hypertension Mother    Diabetes Mother    Cancer Brother        Tonsil   Diabetes Paternal Grandmother    Aortic aneurysm Paternal Grandfather    Lung disease Neg Hx     Living situation: the patient lives with their spouse  Sexual Orientation: Straight  Relationship Status: married  Name of spouse / other:Linda. They have been married for 55 years.  If a parent, number of children / ages:Patient has two daughters and four grand children  Support Systems: spouse  Financial Stress:  No   Income/Employment/Disability: Actor: No   Educational History: Education: college graduate  Religion/Sprituality/World View: Christian  Any cultural differences that may affect / interfere with treatment:  not applicable   Recreation/Hobbies: Being with family  Stressors: Other: adjusting to life changes    Strengths: Supportive Relationships  Barriers:  NA   Legal History: Pending legal issue / charges: The patient has no significant history of legal issues. History of legal issue / charges:  NA  Medical History/Surgical History: reviewed Past Medical History:  Diagnosis  Date   Allergic rhinitis    COPD (chronic obstructive pulmonary disease) ([place])    Coronary artery disease    Diabetes mellitus without complication ([place])    Hyperlipidemia    Pneumonia    once in his 20s & was hospitalized without intubation    Past Surgical History:  Procedure Laterality Date   [place] INTERVENTION  02/07/2019   CORONARY STENT INTERVENTION N/A 02/07/2019   Procedure: CORONARY STENT INTERVENTION;  Surgeon: Wellington Hampshire, MD;  Location: [place] [facility];  Service: Cardiovascular;  Laterality: N/A;   INTRAVASCULAR ULTRASOUND/IVUS N/A 02/07/2019   Procedure: Intravascular Ultrasound/IVUS;  Surgeon: Wellington Hampshire, MD;  Location: [place] [facility];  Service: Cardiovascular;  Laterality: N/A;   LEFT HEART CATH AND CORONARY ANGIOGRAPHY N/A 02/07/2019   Procedure: LEFT HEART CATH AND CORONARY ANGIOGRAPHY;  Surgeon: Wellington Hampshire, MD;  Location: [place] [facility];  Service: Cardiovascular;  Laterality: N/A;    Medications: Current Outpatient Medications  Medication Sig Dispense Refill   atorvastatin (LIPITOR) 80 MG tablet TAKE 1 TABLET BY MOUTH EVERY DAY AT 6 PM 90 tablet 3   clopidogrel (PLAVIX) 75 MG tablet TAKE 1 TABLET BY MOUTH EVERY DAY 90 tablet 3   fluticasone (FLONASE) 50 MCG/ACT nasal spray Place 2 sprays into both nostrils. Daily PRN     JARDIANCE 25 MG TABS tablet Take 12.5 mg by mouth daily.     nitroGLYCERIN (NITROSTAT) 0.4 MG SL tablet Place 1 tablet (0.4 mg total) under the tongue every 5 (five) minutes x 3 doses as needed for chest pain. 28 tablet 1   ONETOUCH VERIO test strip 1 each 2 (two) times daily.     No current facility-administered medications for this visit.    No Known Allergies  Diagnoses:   F32.0 major depressive affective disorder, single episode, mild  Plan of Care: The patient is a 77 year old Caucasian male who was referred due to concern over nicotine mints. The patient lives at home with his wife. The patient meets criteria for a diagnosis of F32.0 major depressive affective disorder, single episode, mild based off of the following:  feeling sad, down, low self-esteem, rumination of thoughts, fatigue, and sometimes lack of motivation. He denied suicidal and homicidal ideation.   The patient stated that he wants to  be happy.   This psychologist makes the recommendation that if the patient wants to follow up, we can discuss what happiness looks like to the patient.    Conception Chancy, PsyD

## 2023-01-12 DIAGNOSIS — M720 Palmar fascial fibromatosis [Dupuytren]: Secondary | ICD-10-CM | POA: Diagnosis not present

## 2023-04-08 DIAGNOSIS — E1169 Type 2 diabetes mellitus with other specified complication: Secondary | ICD-10-CM | POA: Diagnosis not present

## 2023-04-08 DIAGNOSIS — E785 Hyperlipidemia, unspecified: Secondary | ICD-10-CM | POA: Diagnosis not present

## 2023-04-08 DIAGNOSIS — Z125 Encounter for screening for malignant neoplasm of prostate: Secondary | ICD-10-CM | POA: Diagnosis not present

## 2023-04-12 DIAGNOSIS — J439 Emphysema, unspecified: Secondary | ICD-10-CM | POA: Diagnosis not present

## 2023-04-12 DIAGNOSIS — J309 Allergic rhinitis, unspecified: Secondary | ICD-10-CM | POA: Diagnosis not present

## 2023-04-12 DIAGNOSIS — F324 Major depressive disorder, single episode, in partial remission: Secondary | ICD-10-CM | POA: Diagnosis not present

## 2023-04-12 DIAGNOSIS — E119 Type 2 diabetes mellitus without complications: Secondary | ICD-10-CM | POA: Diagnosis not present

## 2023-04-12 DIAGNOSIS — R351 Nocturia: Secondary | ICD-10-CM | POA: Diagnosis not present

## 2023-04-12 DIAGNOSIS — Z6823 Body mass index (BMI) 23.0-23.9, adult: Secondary | ICD-10-CM | POA: Diagnosis not present

## 2023-04-12 DIAGNOSIS — R972 Elevated prostate specific antigen [PSA]: Secondary | ICD-10-CM | POA: Diagnosis not present

## 2023-04-12 DIAGNOSIS — F1021 Alcohol dependence, in remission: Secondary | ICD-10-CM | POA: Diagnosis not present

## 2023-04-14 DIAGNOSIS — H6123 Impacted cerumen, bilateral: Secondary | ICD-10-CM | POA: Diagnosis not present

## 2023-04-14 DIAGNOSIS — H903 Sensorineural hearing loss, bilateral: Secondary | ICD-10-CM | POA: Diagnosis not present

## 2023-04-18 DIAGNOSIS — H35372 Puckering of macula, left eye: Secondary | ICD-10-CM | POA: Diagnosis not present

## 2023-04-18 DIAGNOSIS — H33191 Other retinoschisis and retinal cysts, right eye: Secondary | ICD-10-CM | POA: Diagnosis not present

## 2023-04-18 DIAGNOSIS — E119 Type 2 diabetes mellitus without complications: Secondary | ICD-10-CM | POA: Diagnosis not present

## 2023-04-18 DIAGNOSIS — H11131 Conjunctival pigmentations, right eye: Secondary | ICD-10-CM | POA: Diagnosis not present

## 2023-04-18 DIAGNOSIS — H2513 Age-related nuclear cataract, bilateral: Secondary | ICD-10-CM | POA: Diagnosis not present

## 2023-04-24 ENCOUNTER — Other Ambulatory Visit: Payer: Self-pay | Admitting: Acute Care

## 2023-04-24 DIAGNOSIS — Z122 Encounter for screening for malignant neoplasm of respiratory organs: Secondary | ICD-10-CM

## 2023-04-24 DIAGNOSIS — Z87891 Personal history of nicotine dependence: Secondary | ICD-10-CM

## 2023-06-13 ENCOUNTER — Ambulatory Visit (HOSPITAL_BASED_OUTPATIENT_CLINIC_OR_DEPARTMENT_OTHER)
Admission: RE | Admit: 2023-06-13 | Discharge: 2023-06-13 | Disposition: A | Discharge status: Home / Self Care | Payer: Medicare HMO | Source: Ambulatory Visit | Attending: Family Medicine | Admitting: Family Medicine

## 2023-06-13 DIAGNOSIS — Z122 Encounter for screening for malignant neoplasm of respiratory organs: Secondary | ICD-10-CM | POA: Diagnosis not present

## 2023-06-13 DIAGNOSIS — Z87891 Personal history of nicotine dependence: Secondary | ICD-10-CM | POA: Insufficient documentation

## 2023-06-17 ENCOUNTER — Other Ambulatory Visit: Payer: Self-pay | Admitting: Acute Care

## 2023-06-17 DIAGNOSIS — Z122 Encounter for screening for malignant neoplasm of respiratory organs: Secondary | ICD-10-CM

## 2023-06-17 DIAGNOSIS — Z87891 Personal history of nicotine dependence: Secondary | ICD-10-CM

## 2023-10-17 DIAGNOSIS — E785 Hyperlipidemia, unspecified: Secondary | ICD-10-CM | POA: Diagnosis not present

## 2023-10-17 DIAGNOSIS — I700 Atherosclerosis of aorta: Secondary | ICD-10-CM | POA: Diagnosis not present

## 2023-10-17 DIAGNOSIS — R972 Elevated prostate specific antigen [PSA]: Secondary | ICD-10-CM | POA: Diagnosis not present

## 2023-10-17 DIAGNOSIS — J439 Emphysema, unspecified: Secondary | ICD-10-CM | POA: Diagnosis not present

## 2023-10-17 DIAGNOSIS — F1021 Alcohol dependence, in remission: Secondary | ICD-10-CM | POA: Diagnosis not present

## 2023-10-17 DIAGNOSIS — E119 Type 2 diabetes mellitus without complications: Secondary | ICD-10-CM | POA: Diagnosis not present

## 2023-10-17 DIAGNOSIS — Z6822 Body mass index (BMI) 22.0-22.9, adult: Secondary | ICD-10-CM | POA: Diagnosis not present

## 2023-10-17 DIAGNOSIS — Z0000 Encounter for general adult medical examination without abnormal findings: Secondary | ICD-10-CM | POA: Diagnosis not present

## 2023-10-17 DIAGNOSIS — F324 Major depressive disorder, single episode, in partial remission: Secondary | ICD-10-CM | POA: Diagnosis not present

## 2023-10-17 DIAGNOSIS — I2510 Atherosclerotic heart disease of native coronary artery without angina pectoris: Secondary | ICD-10-CM | POA: Diagnosis not present

## 2023-12-09 ENCOUNTER — Other Ambulatory Visit: Payer: Self-pay

## 2023-12-09 MED ORDER — ATORVASTATIN CALCIUM 80 MG PO TABS: 80.0000 mg | ORAL_TABLET | Freq: Every day | ORAL | 0 refills | Status: DC

## 2023-12-16 ENCOUNTER — Other Ambulatory Visit: Payer: Self-pay

## 2023-12-16 MED ORDER — CLOPIDOGREL BISULFATE 75 MG PO TABS: 75.0000 mg | ORAL_TABLET | Freq: Every day | ORAL | 0 refills | Status: DC

## 2024-01-04 DIAGNOSIS — R351 Nocturia: Secondary | ICD-10-CM | POA: Diagnosis not present

## 2024-01-04 DIAGNOSIS — R3121 Asymptomatic microscopic hematuria: Secondary | ICD-10-CM | POA: Diagnosis not present

## 2024-01-04 DIAGNOSIS — R972 Elevated prostate specific antigen [PSA]: Secondary | ICD-10-CM | POA: Diagnosis not present

## 2024-01-04 DIAGNOSIS — N401 Enlarged prostate with lower urinary tract symptoms: Secondary | ICD-10-CM | POA: Diagnosis not present

## 2024-01-04 DIAGNOSIS — R3912 Poor urinary stream: Secondary | ICD-10-CM | POA: Diagnosis not present

## 2024-01-24 ENCOUNTER — Other Ambulatory Visit: Payer: Self-pay | Admitting: Nurse Practitioner

## 2024-02-10 ENCOUNTER — Other Ambulatory Visit: Payer: Self-pay | Admitting: Nurse Practitioner

## 2024-02-26 ENCOUNTER — Other Ambulatory Visit: Payer: Self-pay | Admitting: Nurse Practitioner

## 2024-02-29 ENCOUNTER — Telehealth: Payer: Self-pay | Admitting: Nurse Practitioner

## 2024-02-29 MED ORDER — ATORVASTATIN CALCIUM 80 MG PO TABS: 80.0000 mg | ORAL_TABLET | Freq: Every day | ORAL | 0 refills | Status: DC

## 2024-02-29 MED ORDER — CLOPIDOGREL BISULFATE 75 MG PO TABS: 75.0000 mg | ORAL_TABLET | Freq: Every day | ORAL | 0 refills | Status: DC

## 2024-02-29 NOTE — Telephone Encounter (Signed)
*  STAT* If patient is at the pharmacy, call can be transferred to refill team.   1. Which medications need to be refilled? (please list name of each medication and dose if known) Atorvastatin and  Clopidogrel   2. Would you like to learn more about the convenience, safety, & potential cost savings by using the [facility]?    3. Are you open to using the [facility] (Type [facility].    4. Which pharmacy/location (including street and city if local pharmacy) is medication to be sent to?[facility] [address] [place]   5. Do they need a 30 day or 90 day supply? 90 days and refills

## 2024-02-29 NOTE — Telephone Encounter (Signed)
 Pt's medications were sent to pt's pharmacy as requested. Confirmation received.

## 2024-02-29 NOTE — Telephone Encounter (Signed)
 Called pt and pt stated that he would make an appt. Waiting for pt to schedule an appt before sending in medication refills.

## 2024-03-15 NOTE — Progress Notes (Unsigned)
 Cardiology Office Note    Patient Name: Billy Lawson Date of Encounter: 03/16/2024  Primary Care Provider:  Daisy Floro, MD Primary Cardiologist:  Lesleigh Noe, MD (Inactive) Primary Electrophysiologist: None   Past Medical History    Past Medical History:  Diagnosis Date   Allergic rhinitis    COPD (chronic obstructive pulmonary disease) (HCC)    Coronary artery disease    Diabetes mellitus without complication (HCC)    Hyperlipidemia    Pneumonia    once in his 73s & was hospitalized without intubation    History of Present Illness  Billy Lawson is a 78 y.o. male with PMH of CAD s/p NSTEMI 2020 with DES to LAD, DM type II, HLD, COPD, tobacco abuse who presents today for follow-up.  Billy Lawson was seen initially by Dr. Katrinka Blazing in 2020 for NSTEMI and was treated with DES x 1 to proximal mid LAD.  He had 2D echo completed that showed EF of 55 to 60% with mild dilation of aortic root.  He was seen in follow-up last by Dr. Katrinka Blazing on 08/2021 and reported some dizziness with standing but denied any chest pain.  He was found to be hypotensive and had losartan and carvedilol discontinued following taper.  He was last seen for surgical clearance on 12/02/2022 and reported doing well with stable blood pressures.  He denied any chest pain and was given clearance for his surgical procedure.  Billy Lawson presents today for overdue annual follow-up.  He reports since his previous follow-up doing well.  He reports no chest pain, shortness of breath, heart palpitations, or dizziness since the last visit.  Today's visit is blood pressure is stable at 126/68 mmHg. He has been more active with the arrival of spring, engaging in yard work and other activities without experiencing any cardiac symptoms. He has a history of coronary artery disease and previously experienced a heart attack, which he describes as having no significant physical aftermath but some mental impact  initially. No further cardiac events or symptoms since then. He has diabetes with a recent A1c of 7.3% as of October, above the target of less than 7%. Currently taking Jardiance, but there are concerns about the cost as his insurance increased the price from $47 to $158 per month. Despite the cost, he continues to take Jardiance and plans to discuss alternatives with his primary care doctor next month. No additional medications for diabetes management have been prescribed. Cholesterol levels were last checked in April of the previous year, with an LDL of 78 mg/dL, slightly above the target of less than 70 mg/dL. Triglycerides and total cholesterol are within the desired range. Currently on Lipitor for cholesterol management. He uses nicotine lozenges, consuming about 7 to 10 lozenges of 2 mg each per day. He has been addicted to nicotine since he was 78 years old and has previously smoked heavily. He acknowledges the lozenges as a less harmful alternative to smoking, although he experiences anxiety when attempting to reduce his use. Patient denies chest pain, palpitations, dyspnea, PND, orthopnea, nausea, vomiting, dizziness, syncope, edema, weight gain, or early satiety.  Discussed the use of AI scribe software for clinical note transcription with the patient, who gave verbal consent to proceed.  History of Present Illness   Review of Systems  Please see the history of present illness.    All other systems reviewed and are otherwise negative except as noted above.  Physical Exam  Wt Readings from Last 3 Encounters:  03/16/24 161 lb (73 kg)  12/02/22 159 lb (72.1 kg)  06/10/22 155 lb (70.3 kg)   VS: Vitals:   03/16/24 1011  BP: 126/68  Pulse: 87  SpO2: 98%  ,Body mass index is 22.45 kg/m. GEN: Well nourished, well developed in no acute distress Neck: No JVD; No carotid bruits Pulmonary: Clear to auscultation without rales, wheezing or rhonchi  Cardiovascular: Normal rate. Regular  rhythm. Normal S1. Normal S2.   Murmurs: There is no murmur.  ABDOMEN: Soft, non-tender, non-distended EXTREMITIES:  No edema; No deformity   EKG/LABS/ Recent Cardiac Studies   ECG personally reviewed by me today -sinus rhythm with heart rate of 87 bpm and no acute changes consistent with previous EKG.  Risk Assessment/Calculations:          Lab Results  Component Value Date   WBC 10.2 02/08/2019   HGB 12.9 (L) 02/08/2019   HCT 40.0 02/08/2019   MCV 85.5 02/08/2019   PLT 156 02/08/2019   Lab Results  Component Value Date   CREATININE 1.15 02/19/2019   BUN 20 02/19/2019   NA 138 02/19/2019   K 4.2 02/19/2019   CL 102 02/19/2019   CO2 21 02/19/2019   Lab Results  Component Value Date   CHOL 114 08/14/2019   HDL 36 (L) 08/14/2019   LDLCALC 61 08/14/2019   TRIG 87 08/14/2019   CHOLHDL 3.2 08/14/2019    Lab Results  Component Value Date   HGBA1C 7.0 (H) 02/06/2019   Assessment & Plan    1.Coronary artery disease: -s/p NSTEMI with PCI/DES to LAD -Patient reports no chest pain or angina since previous follow-up. - Continue current cardiac management. - Encourage regular physical activity, including walking. - Establish care with a new cardiologist for ongoing management.  2.  Essential HTN: -Patient's blood pressure today was stable at 126/68 -Continue low-sodium heart healthy diet.  3.  HLD: -Patient's last LDL cholesterol was stable at 78 - Consider adding ezetimibe if lifestyle modifications do not achieve LDL goal. - Encourage dietary adjustments and increased physical activity to lower LDL cholesterol.  4.  History of tobacco abuse: -Patient reports no longer smoking but does abuse nicotine lozenges 7 to 10/day. - Encourage gradual reduction of nicotine lozenge use. - Discuss potential strategies for reducing nicotine dependence.  5. Type 2 diabetes mellitus: A1c 7.3%, above target <7%. On empagliflozin, cost increased. Discussed alternatives with PCP,  Dr. Tenny Craw. Empagliflozin benefits cardiac health. - Provide paperwork for patient assistance programs for empagliflozin. - Discuss potential alternatives with primary care physician if cost remains prohibitive.    Disposition: Follow-up with Lesleigh Noe, MD (Inactive) or APP in 12 months   Signed, Napoleon Form, Leodis Rains, NP 03/16/2024, 10:45 AM [facility]

## 2024-03-16 ENCOUNTER — Ambulatory Visit: Attending: Internal Medicine | Admitting: Nurse Practitioner

## 2024-03-16 ENCOUNTER — Encounter: Payer: Self-pay | Admitting: Nurse Practitioner

## 2024-03-16 VITALS — BP 126/68 | HR 87 | Ht 71.0 in | Wt 161.0 lb

## 2024-03-16 DIAGNOSIS — I10 Essential (primary) hypertension: Secondary | ICD-10-CM | POA: Diagnosis not present

## 2024-03-16 DIAGNOSIS — E1159 Type 2 diabetes mellitus with other circulatory complications: Secondary | ICD-10-CM | POA: Diagnosis not present

## 2024-03-16 DIAGNOSIS — E785 Hyperlipidemia, unspecified: Secondary | ICD-10-CM

## 2024-03-16 DIAGNOSIS — I2510 Atherosclerotic heart disease of native coronary artery without angina pectoris: Secondary | ICD-10-CM

## 2024-03-16 DIAGNOSIS — Z720 Tobacco use: Secondary | ICD-10-CM | POA: Diagnosis not present

## 2024-03-16 MED ORDER — CLOPIDOGREL BISULFATE 75 MG PO TABS: 75.0000 mg | ORAL_TABLET | Freq: Every day | ORAL | 1 refills | Status: DC

## 2024-03-16 MED ORDER — ATORVASTATIN CALCIUM 80 MG PO TABS: 80.0000 mg | ORAL_TABLET | Freq: Every day | ORAL | 1 refills | Status: DC

## 2024-03-16 NOTE — Patient Instructions (Addendum)
 Medication Instructions:  Your physician recommends that you continue on your current medications as directed. Please refer to the Current Medication list given to you today. *If you need a refill on your cardiac medications before your next appointment, please call your pharmacy*  Lab Work: None ordered If you have labs (blood work) drawn today and your tests are completely normal, you will receive your results only by: MyChart Message (if you have MyChart) OR A paper copy in the mail If you have any lab test that is abnormal or we need to change your treatment, we will call you to review the results.  Testing/Procedures: None ordered  Follow-Up: At [facility], you and your health needs are our priority.  As part of our continuing mission to provide you with exceptional heart care, our providers are all part of one team.  This team includes your primary Cardiologist (physician) and Advanced Practice Providers or APPs (Physician Assistants and Nurse Practitioners) who all work together to provide you with the care you need, when you need it.  Your next appointment:   6 month(s)  Provider:   Robin Searing, NP   (will need to establish with new cardiologist)  We recommend signing up for the patient portal called "MyChart".  Sign up information is provided on this After Visit Summary.  MyChart is used to connect with patients for Virtual Visits (Telemedicine).  Patients are able to view lab/test results, encounter notes, upcoming appointments, etc.  Non-urgent messages can be sent to your provider as well.   To learn more about what you can do with MyChart, go to [email or web address].   Other Instructions Please check your weight daily. Please contact the office if you gain more than 2lbs in a day or 5lbs in a week.  Limit your salt intake to 1500-2000mg  per day or 500mg  of Sodium per meal.       1st Floor: - Lobby - Registration  - Pharmacy  - Lab - Cafe  2nd  Floor: - PV Lab - Diagnostic Testing (echo, CT, nuclear med)  3rd Floor: - Vacant  4th Floor: - TCTS (cardiothoracic surgery) - AFib Clinic - Structural Heart Clinic - Vascular Surgery  - Vascular Ultrasound  5th Floor: - HeartCare Cardiology (general and EP) - Clinical Pharmacy for coumadin, hypertension, lipid, weight-loss medications, and med management appointments    Valet parking services will be available as well.

## 2024-04-18 DIAGNOSIS — L821 Other seborrheic keratosis: Secondary | ICD-10-CM | POA: Diagnosis not present

## 2024-04-18 DIAGNOSIS — H547 Unspecified visual loss: Secondary | ICD-10-CM | POA: Diagnosis not present

## 2024-04-18 DIAGNOSIS — Z6823 Body mass index (BMI) 23.0-23.9, adult: Secondary | ICD-10-CM | POA: Diagnosis not present

## 2024-04-18 DIAGNOSIS — E785 Hyperlipidemia, unspecified: Secondary | ICD-10-CM | POA: Diagnosis not present

## 2024-04-18 DIAGNOSIS — E1169 Type 2 diabetes mellitus with other specified complication: Secondary | ICD-10-CM | POA: Diagnosis not present

## 2024-04-18 DIAGNOSIS — Z125 Encounter for screening for malignant neoplasm of prostate: Secondary | ICD-10-CM | POA: Diagnosis not present

## 2024-04-24 DIAGNOSIS — H35372 Puckering of macula, left eye: Secondary | ICD-10-CM | POA: Diagnosis not present

## 2024-04-24 DIAGNOSIS — E119 Type 2 diabetes mellitus without complications: Secondary | ICD-10-CM | POA: Diagnosis not present

## 2024-04-24 DIAGNOSIS — H11131 Conjunctival pigmentations, right eye: Secondary | ICD-10-CM | POA: Diagnosis not present

## 2024-04-24 DIAGNOSIS — H2513 Age-related nuclear cataract, bilateral: Secondary | ICD-10-CM | POA: Diagnosis not present

## 2024-04-24 DIAGNOSIS — H33191 Other retinoschisis and retinal cysts, right eye: Secondary | ICD-10-CM | POA: Diagnosis not present

## 2024-06-13 ENCOUNTER — Ambulatory Visit (HOSPITAL_BASED_OUTPATIENT_CLINIC_OR_DEPARTMENT_OTHER)
Admission: RE | Admit: 2024-06-13 | Discharge: 2024-06-13 | Disposition: A | Discharge status: Home / Self Care | Source: Ambulatory Visit | Attending: Acute Care | Admitting: Acute Care

## 2024-06-13 DIAGNOSIS — Z122 Encounter for screening for malignant neoplasm of respiratory organs: Secondary | ICD-10-CM | POA: Diagnosis not present

## 2024-06-13 DIAGNOSIS — Z87891 Personal history of nicotine dependence: Secondary | ICD-10-CM | POA: Diagnosis not present

## 2024-06-21 ENCOUNTER — Other Ambulatory Visit: Payer: Self-pay | Admitting: Acute Care

## 2024-06-21 DIAGNOSIS — H2512 Age-related nuclear cataract, left eye: Secondary | ICD-10-CM | POA: Diagnosis not present

## 2024-06-21 DIAGNOSIS — Z87891 Personal history of nicotine dependence: Secondary | ICD-10-CM

## 2024-06-21 DIAGNOSIS — Z122 Encounter for screening for malignant neoplasm of respiratory organs: Secondary | ICD-10-CM

## 2024-07-02 DIAGNOSIS — H2511 Age-related nuclear cataract, right eye: Secondary | ICD-10-CM | POA: Diagnosis not present

## 2024-07-05 DIAGNOSIS — H2511 Age-related nuclear cataract, right eye: Secondary | ICD-10-CM | POA: Diagnosis not present

## 2024-07-11 DIAGNOSIS — R351 Nocturia: Secondary | ICD-10-CM | POA: Diagnosis not present

## 2024-07-11 DIAGNOSIS — R972 Elevated prostate specific antigen [PSA]: Secondary | ICD-10-CM | POA: Diagnosis not present

## 2024-07-11 DIAGNOSIS — R3121 Asymptomatic microscopic hematuria: Secondary | ICD-10-CM | POA: Diagnosis not present

## 2024-07-11 DIAGNOSIS — N401 Enlarged prostate with lower urinary tract symptoms: Secondary | ICD-10-CM | POA: Diagnosis not present

## 2024-09-10 NOTE — Progress Notes (Signed)
 " Cardiology Office Note    Patient Name: Billy Lawson Date of Encounter: 09/10/2024  Primary Care Provider:  Okey Carlin Redbird, MD Primary Cardiologist:  Victory LELON Claudene DOUGLAS, MD (Inactive) Primary Electrophysiologist: None   Past Medical History    Past Medical History:  Diagnosis Date   Allergic rhinitis    COPD (chronic obstructive pulmonary disease) (HCC)    Coronary artery disease    Diabetes mellitus without complication (HCC)    Hyperlipidemia    Pneumonia    once in his 20s & was hospitalized without intubation    History of Present Illness  Billy Lawson is a 78 y.o. male with PMH of CAD s/p NSTEMI 2020 with DES to LAD, DM type II, HLD, COPD, tobacco abuse who presents today for follow-up.   Mr. [name] was last seen on 03/16/2024 for annual follow-up.  During his visit he was doing well with stable blood pressures and no complaint of chest pain.  He was noted to no longer be smoking but abusing nicotine lozenges with up to 7 to 10/day.  He was provided patient assistance paperwork for Jardiance.  He had no further changes made to his medications at that time.  Mr. [name] presents today for 74-month follow-up.  He was originally scheduled to establish care with his new cardiologist.  He reports overall doing well with no new cardiac complaints.  He switched from Jardiance to Farxiga earlier this year due to cost issues, as his insurance increased the price of Jardiance. He is currently paying $150 per month for Farxiga and is interested in exploring patient assistance programs to help with the cost. He quit smoking after a heart attack but continues to use nicotine lozenges, consuming 7-10 per day. He has tried various cessation methods in the past, including Chantix and Wellbutrin, without success. He is considering reducing his lozenge use. He has a history of alcohol use but has been sober for 22 years, motivated by personal reasons and family influence. He  describes quitting alcohol as easier than quitting smoking. His blood pressure has been stable. He is not currently on any blood pressure medications. No chest pain, no new shortness of breath, no swelling, stable blood pressure, and stable blood sugar levels. Patient denies chest pain, palpitations, dyspnea, PND, orthopnea, nausea, vomiting, dizziness, syncope, edema, weight gain, or early satiety.  Discussed the use of AI scribe software for clinical note transcription with the patient, who gave verbal consent to proceed.  History of Present Illness   Review of Systems  Please see the history of present illness.    All other systems reviewed and are otherwise negative except as noted above.  Physical Exam    Wt Readings from Last 3 Encounters:  03/16/24 161 lb (73 kg)  12/02/22 159 lb (72.1 kg)  06/10/22 155 lb (70.3 kg)   CD:Uyzmz were no vitals filed for this visit.,There is no height or weight on file to calculate BMI. GEN: Well nourished, well developed in no acute distress Neck: No JVD; No carotid bruits Pulmonary: Clear to auscultation without rales, wheezing or rhonchi  Cardiovascular: Normal rate. Regular rhythm. Normal S1. Normal S2.   Murmurs: There is no murmur.  ABDOMEN: Soft, non-tender, non-distended EXTREMITIES:  No edema; No deformity   EKG/LABS/ Recent Cardiac Studies   ECG personally reviewed by me today -none completed today  Risk Assessment/Calculations:          Lab Results  Component Value Date  WBC 10.2 02/08/2019   HGB 12.9 (L) 02/08/2019   HCT 40.0 02/08/2019   MCV 85.5 02/08/2019   PLT 156 02/08/2019   Lab Results  Component Value Date   CREATININE 1.15 02/19/2019   BUN 20 02/19/2019   NA 138 02/19/2019   K 4.2 02/19/2019   CL 102 02/19/2019   CO2 21 02/19/2019   Lab Results  Component Value Date   CHOL 114 08/14/2019   HDL 36 (L) 08/14/2019   LDLCALC 61 08/14/2019   TRIG 87 08/14/2019   CHOLHDL 3.2 08/14/2019    Lab Results   Component Value Date   HGBA1C 7.0 (H) 02/06/2019   Assessment & Plan    Assessment and Plan Assessment & Plan Atherosclerotic heart disease with prior stent placement Well-managed with no new symptoms. Continues on Plavix  due to severe prior blockage and stent placement. Bruising and bleeding noted as side effects, but benefits outweigh risks. - Continue Plavix  for stent maintenance.  Aortic atherosclerosis Managed with Lipitor  and Plavix  to prevent progression. Calcification noted on annual CT scans. - Continue Lipitor  for cholesterol management. - Continue Plavix  for atherosclerosis management.  Hyperlipidemia Cholesterol levels well-controlled with Lipitor . - Continue Lipitor  for cholesterol management.  Type 2 diabetes mellitus A1c stabilized around 7.1-7.2. Managed with Farxiga, maintaining stable blood sugar levels. Discussed potential need for Metformin to further lower A1c. Patient assistance programs for Doreen to be explored. - Continue Farxiga for blood sugar management. - Explore patient assistance programs for Farxiga. - Consider Metformin if A1c does not improve.  Nicotine dependence (nicotine lozenge use) Continued use of nicotine lozenges, approximately 7-10 per day. Previous attempts with nicotine patch, gum, and Chantix were unsuccessful. Discussed potential use of nicotine inhaler and revisiting Chantix. Encouraged to contact 1-800-QUIT-NOW for additional support and alternatives. - Consider nicotine inhaler or revisiting Chantix for cessation. - Contact 1-800-QUIT-NOW for support and alternatives. - Gradually reduce nicotine lozenge use.    1.Coronary artery disease: -s/p NSTEMI with PCI/DES to LAD -Patient reports no complaints of chest pain or angina since prior follow-up. - Continue current GDMT with Plavix  75 mg and Lipitor  80 mg with as needed Nitrostat  0.4 mg  2.Essential HTN: -Patient's blood pressure today was stable at 122/58 - Continue  low-sodium heart healthy diet  3.HLD: -Patient's last LDL cholesterol was controlled at 65 - Continue atorvastatin  80 mg daily  4.History of tobacco abuse: -Continued use of nicotine lozenges, approximately 7-10 per day. Previous attempts with nicotine patch, gum, and Chantix were unsuccessful. Discussed potential use of nicotine inhaler and revisiting Chantix. Encouraged to contact 1-800-QUIT-NOW for additional support and alternatives. - Consider nicotine inhaler or revisiting Chantix for cessation. - Contact 1-800-QUIT-NOW for support and alternatives. - Gradually reduce nicotine lozenge use.  5.  Type 2 diabetes mellitus: - Most recent hemoglobin A1c was 7.4 - Continue Doreen per PCP - Explore patient assistance programs for Farxiga.     Disposition: Follow-up with new cardiologist or APP in 6 months    Signed, Wyn Raddle, Jackee Shove, NP 09/10/2024, 10:31 AM [facility] "

## 2024-09-11 ENCOUNTER — Encounter: Payer: Self-pay | Admitting: Nurse Practitioner

## 2024-09-11 ENCOUNTER — Ambulatory Visit: Attending: Nurse Practitioner | Admitting: Nurse Practitioner

## 2024-09-11 ENCOUNTER — Telehealth: Payer: Self-pay

## 2024-09-11 VITALS — BP 122/58 | HR 72 | Ht 71.0 in | Wt 159.0 lb

## 2024-09-11 DIAGNOSIS — E1159 Type 2 diabetes mellitus with other circulatory complications: Secondary | ICD-10-CM

## 2024-09-11 DIAGNOSIS — E785 Hyperlipidemia, unspecified: Secondary | ICD-10-CM | POA: Diagnosis not present

## 2024-09-11 DIAGNOSIS — I2510 Atherosclerotic heart disease of native coronary artery without angina pectoris: Secondary | ICD-10-CM | POA: Diagnosis not present

## 2024-09-11 DIAGNOSIS — I10 Essential (primary) hypertension: Secondary | ICD-10-CM

## 2024-09-11 DIAGNOSIS — Z87891 Personal history of nicotine dependence: Secondary | ICD-10-CM

## 2024-09-11 NOTE — Patient Instructions (Signed)
 Medication Instructions:  Your physician recommends that you continue on your current medications as directed. Please refer to the Current Medication list given to you today.  *If you need a refill on your cardiac medications before your next appointment, please call your pharmacy*  Lab Work: None ordered  If you have labs (blood work) drawn today and your tests are completely normal, you will receive your results only by: MyChart Message (if you have MyChart) OR A paper copy in the mail If you have any lab test that is abnormal or we need to change your treatment, we will call you to review the results.  Testing/Procedures: None ordered  Follow-Up: At [facility], you and your health needs are our priority.  As part of our continuing mission to provide you with exceptional heart care, our providers are all part of one team.  This team includes your primary Cardiologist (physician) and Advanced Practice Providers or APPs (Physician Assistants and Nurse Practitioners) who all work together to provide you with the care you need, when you need it.  Your next appointment:   6 month(s)  Provider:   Georganna Archer, MD    We recommend signing up for the patient portal called MyChart.  Sign up information is provided on this After Visit Summary.  MyChart is used to connect with patients for Virtual Visits (Telemedicine).  Patients are able to view lab/test results, encounter notes, upcoming appointments, etc.  Non-urgent messages can be sent to your provider as well.   To learn more about what you can do with MyChart, go to [email or web address].   Other Instructions

## 2024-09-11 NOTE — Telephone Encounter (Signed)
 The patient was seen in the office today and wants to get patient assistance for [place]. Can you please help with this?

## 2024-09-12 ENCOUNTER — Telehealth: Payer: Self-pay | Admitting: Pharmacy Technician

## 2024-09-12 ENCOUNTER — Other Ambulatory Visit (HOSPITAL_COMMUNITY): Payer: Self-pay

## 2024-09-12 NOTE — Telephone Encounter (Signed)
    Patient Advocate Encounter   The patient was approved for a [employer] grant that will help cover the cost of farxiga Total amount awarded, 7500.  Effective: 08/13/24 - 08/12/25   APW:[id number] ERW:[id number] Hmnle:[id number] PI:[id number] [employer] ID: [id number]   Pharmacy provided with approval and processing information. Patient informed via mychart  I called the patient to advise him [facility] can [facility] to get a refund for what he just paid as well

## 2024-10-10 ENCOUNTER — Telehealth: Payer: Self-pay | Admitting: Student in an Organized Health Care Education/Training Program

## 2024-10-10 MED ORDER — ATORVASTATIN CALCIUM 80 MG PO TABS: 80.0000 mg | ORAL_TABLET | Freq: Every day | ORAL | 3 refills | Status: AC

## 2024-10-10 MED ORDER — CLOPIDOGREL BISULFATE 75 MG PO TABS: 75.0000 mg | ORAL_TABLET | Freq: Every day | ORAL | 3 refills | Status: AC

## 2024-10-10 NOTE — Telephone Encounter (Signed)
 RX sent in

## 2024-10-10 NOTE — Telephone Encounter (Signed)
*  STAT* If patient is at the pharmacy, call can be transferred to refill team.   1. Which medications need to be refilled? (please list name of each medication and dose if known)   atorvastatin  (LIPITOR ) 80 MG tablet  clopidogrel  (PLAVIX ) 75 MG tablet   2. Would you like to learn more about the convenience, safety, & potential cost savings by using the [facility]?   3. Are you open to using the [facility] (Type [facility]. ).  4. Which pharmacy/location (including street and city if local pharmacy) is medication to be sent to?  [id number] [place], [place] - [address]   5. Do they need a 30 day or 90 day supply?   90 day  Patient stated he is almost out of this medication.

## 2024-10-15 DIAGNOSIS — M26622 Arthralgia of left temporomandibular joint: Secondary | ICD-10-CM | POA: Diagnosis not present

## 2024-10-19 DIAGNOSIS — J439 Emphysema, unspecified: Secondary | ICD-10-CM | POA: Diagnosis not present

## 2024-10-19 DIAGNOSIS — Z0000 Encounter for general adult medical examination without abnormal findings: Secondary | ICD-10-CM | POA: Diagnosis not present

## 2024-10-19 DIAGNOSIS — Z23 Encounter for immunization: Secondary | ICD-10-CM | POA: Diagnosis not present

## 2024-10-19 DIAGNOSIS — E1169 Type 2 diabetes mellitus with other specified complication: Secondary | ICD-10-CM | POA: Diagnosis not present

## 2024-10-19 DIAGNOSIS — J302 Other seasonal allergic rhinitis: Secondary | ICD-10-CM | POA: Diagnosis not present

## 2024-10-19 DIAGNOSIS — Z6822 Body mass index (BMI) 22.0-22.9, adult: Secondary | ICD-10-CM | POA: Diagnosis not present

## 2024-10-19 DIAGNOSIS — F1021 Alcohol dependence, in remission: Secondary | ICD-10-CM | POA: Diagnosis not present

## 2024-10-19 DIAGNOSIS — F324 Major depressive disorder, single episode, in partial remission: Secondary | ICD-10-CM | POA: Diagnosis not present

## 2025-03-14 ENCOUNTER — Ambulatory Visit: Admitting: Student in an Organized Health Care Education/Training Program
